# Patient Record
Sex: Female | Born: 1950
Health system: Southern US, Community
[De-identification: ages and names within clinical notes are randomized; demographics above are authoritative.]

## PROBLEM LIST (undated history)

## (undated) DIAGNOSIS — E7800 Pure hypercholesterolemia, unspecified: Secondary | ICD-10-CM

## (undated) DIAGNOSIS — I10 Essential (primary) hypertension: Secondary | ICD-10-CM

## (undated) DIAGNOSIS — M1990 Unspecified osteoarthritis, unspecified site: Secondary | ICD-10-CM

## (undated) DIAGNOSIS — K219 Gastro-esophageal reflux disease without esophagitis: Secondary | ICD-10-CM

## (undated) HISTORY — PX: COLONOSCOPY W/ POLYPECTOMY: SHX1380

## (undated) HISTORY — PX: TUBAL LIGATION: SHX77

---

## 2012-04-15 DIAGNOSIS — E7800 Pure hypercholesterolemia, unspecified: Secondary | ICD-10-CM

## 2012-04-15 HISTORY — DX: Pure hypercholesterolemia, unspecified: E78.00

## 2012-04-26 ENCOUNTER — Encounter (HOSPITAL_COMMUNITY): Payer: Self-pay | Admitting: Pharmacy Technician

## 2012-04-26 NOTE — Progress Notes (Signed)
04-26-12 need MD order entry in Epic, PST visit is 05-03-12

## 2012-04-29 ENCOUNTER — Other Ambulatory Visit: Payer: Self-pay | Admitting: Orthopedic Surgery

## 2012-04-29 MED ORDER — DEXAMETHASONE SODIUM PHOSPHATE 10 MG/ML IJ SOLN: 10.0000 mg | Freq: Once | INTRAMUSCULAR | Status: DC

## 2012-04-29 MED ORDER — BUPIVACAINE LIPOSOME 1.3 % IJ SUSP: 20.0000 mL | Freq: Once | INTRAMUSCULAR | Status: DC

## 2012-04-29 NOTE — Progress Notes (Signed)
Preoperative surgical orders have been place into the Epic hospital system for Dorothy Lawson on 04/29/2012, 11:05 AM  by Patrica Duel for surgery on 05/10/2012.  Preop Total Knee orders including Experal, IV Tylenol, and IV Decadron as long as there are no contraindications to the above medications. Avel Peace, PA-C

## 2012-05-03 ENCOUNTER — Ambulatory Visit (HOSPITAL_COMMUNITY)
Admission: RE | Admit: 2012-05-03 | Discharge: 2012-05-03 | Disposition: A | Discharge status: Home / Self Care | Payer: BC Managed Care – PPO | Source: Ambulatory Visit | Attending: Orthopedic Surgery | Admitting: Orthopedic Surgery

## 2012-05-03 ENCOUNTER — Encounter (HOSPITAL_COMMUNITY)
Admission: RE | Admit: 2012-05-03 | Discharge: 2012-05-03 | Disposition: A | Discharge status: Home / Self Care | Payer: BC Managed Care – PPO | Source: Ambulatory Visit | Attending: Orthopedic Surgery | Admitting: Orthopedic Surgery

## 2012-05-03 ENCOUNTER — Encounter (HOSPITAL_COMMUNITY): Payer: Self-pay

## 2012-05-03 DIAGNOSIS — M1710 Unilateral primary osteoarthritis, unspecified knee: Secondary | ICD-10-CM | POA: Insufficient documentation

## 2012-05-03 DIAGNOSIS — I10 Essential (primary) hypertension: Secondary | ICD-10-CM | POA: Insufficient documentation

## 2012-05-03 DIAGNOSIS — M1990 Unspecified osteoarthritis, unspecified site: Secondary | ICD-10-CM

## 2012-05-03 DIAGNOSIS — Z01812 Encounter for preprocedural laboratory examination: Secondary | ICD-10-CM | POA: Insufficient documentation

## 2012-05-03 DIAGNOSIS — M4180 Other forms of scoliosis, site unspecified: Secondary | ICD-10-CM | POA: Insufficient documentation

## 2012-05-03 DIAGNOSIS — Z01818 Encounter for other preprocedural examination: Secondary | ICD-10-CM | POA: Insufficient documentation

## 2012-05-03 HISTORY — DX: Gastro-esophageal reflux disease without esophagitis: K21.9

## 2012-05-03 HISTORY — DX: Unspecified osteoarthritis, unspecified site: M19.90

## 2012-05-03 HISTORY — DX: Essential (primary) hypertension: I10

## 2012-05-03 HISTORY — DX: Pure hypercholesterolemia, unspecified: E78.00

## 2012-05-03 LAB — CBC
MCH: 31 pg (ref 26.0–34.0)
MCHC: 33.7 g/dL (ref 30.0–36.0)
Platelets: 293 10*3/uL (ref 150–400)
RBC: 4.1 MIL/uL (ref 3.87–5.11)

## 2012-05-03 LAB — COMPREHENSIVE METABOLIC PANEL
ALT: 14 U/L (ref 0–35)
AST: 18 U/L (ref 0–37)
Calcium: 9.6 mg/dL (ref 8.4–10.5)
Sodium: 139 mEq/L (ref 135–145)
Total Protein: 6.8 g/dL (ref 6.0–8.3)

## 2012-05-03 LAB — URINALYSIS, ROUTINE W REFLEX MICROSCOPIC
Protein, ur: NEGATIVE mg/dL
Urobilinogen, UA: 0.2 mg/dL (ref 0.0–1.0)

## 2012-05-03 LAB — SURGICAL PCR SCREEN
MRSA, PCR: NEGATIVE
Staphylococcus aureus: NEGATIVE

## 2012-05-03 LAB — URINE MICROSCOPIC-ADD ON

## 2012-05-03 NOTE — Patient Instructions (Signed)
[address] BRIG[address] ESTELL  05/03/2012   Your procedure is scheduled on: 4-7  -2014  Report to [facility] at     1000   AM .  Call this number if you have problems the morning of surgery: [phone]  Or Presurgical Testing [phone](Lucrecia Mcphearson)   Do not eat food:After Midnight.  May have clear liquids:up to 6 Hours before arrival. Nothing after : 0700 AM  Clear liquids include soda, tea, black coffee, apple or grape juice, broth.  Take these medicines the morning of surgery with A SIP OF WATER: Metoprolol   Do not wear jewelry, make-up or nail polish.  Do not wear lotions, powders, or perfumes. You may wear deodorant.  Do not shave 12 hours prior to first CHG shower(legs and under arms).(face and neck okay.)  Do not bring valuables to the hospital.  Contacts, dentures or bridgework,body piercing,  may not be worn into surgery.  Leave suitcase in the car. After surgery it may be brought to your room.  For patients admitted to the hospital, checkout time is 11:00 AM the day of discharge.   Patients discharged the day of surgery will not be allowed to drive home. Must have responsible person with you x 24 hours once discharged.  Name and phone number of your driver: Alvena Kiernan, spouse- [phone] cell  Special Instructions: CHG(Chlorhedine 4%-"Hibiclens","Betasept","Aplicare") Shower Use Special Wash: see special instructions.(avoid face and genitals)   Please read over the following fact sheets that you were given: MRSA Information, Blood Transfusion fact sheet, Incentive Spirometry Instruction.    Failure to follow these instructions may result in Cancellation of your surgery.   Patient signature_______________________________________________________

## 2012-05-03 NOTE — Pre-Procedure Instructions (Signed)
05-03-12 EKG/ CXR done today.

## 2012-05-04 LAB — URINE CULTURE

## 2012-05-09 ENCOUNTER — Other Ambulatory Visit: Payer: Self-pay | Admitting: Orthopedic Surgery

## 2012-05-09 MED ORDER — TRANEXAMIC ACID 100 MG/ML IV SOLN: 1000.0000 mg | INTRAVENOUS | Status: DC

## 2012-05-09 NOTE — H&P (Signed)
Dorothy Lawson  DOB: Aug 02, 1950 Married / Language: English / Race: White Female  Date of Admission:  05/10/2012  Chief Complaint:  Left Knee Pain  History of Present Illness The patient is a 62 year old female who comes in today for a preoperative History and Physical. The patient is scheduled for a left total knee arthroplasty to be performed by Dr. Gus Rankin. Aluisio, MD at [facility] on 05/10/2012. The patient is a 62 year old female who presents with knee complaints. The patient is seen today for a second opinion. The patient reports left knee symptoms which began 3 year(s) ago without any known injury.The patient feels that the symptoms are worsening. Prior to being seen today the patient was previously evaluated by a colleague (Dr. Chaney Malling). Previous work-up for this problem has included knee x-rays. Past treatment for this problem has included knee brace, intra-articular injection of corticosteroids, nonsteroidal anti-inflammatory drugs and non-opioid analgesics. Symptoms are reported to be located in the left knee and include knee pain, tenderness, swelling and decreased range of motion. Her pain has gotten progressively worse over time. She's had cortisone and Visco supplements in the past. The left knee is now causing her to be unable to do things that she desires. It's limiting what she can and cannot do. Pain occurs throughout the day. Sometimes it is also associated with pain at night. She's been told in the past that she had bad arthritis and would eventually need a knee replacement. She is ready to proceed with surgery. They have been treated conservatively in the past for the above stated problem and despite conservative measures, they continue to have progressive pain and severe functional limitations and dysfunction. They have failed non-operative management including home exercise, medications. It is felt that they would benefit from undergoing total joint  replacement. Risks and benefits of the procedure have been discussed with the patient and they elect to proceed with surgery. There are no active contraindications to surgery such as ongoing infection or rapidly progressive neurological disease.  Problem List Primary osteoarthritis of one knee (715.16)   Family History Hypertension. father Cancer. mother and sister   Social History Marital status. married Number of flights of stairs before winded. 2-3 Living situation. live with spouse Exercise. Exercises daily; does other Illicit drug use. no Pain Contract. no Tobacco / smoke exposure. no Tobacco use. never smoker Drug/Alcohol Rehab (Previously). no Current work status. 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 2 Alcohol use. never consumed alcohol Post-Surgical Plans. Plan is to go home with husband. Advance Directives. Living Will, Healthcare POA   Medication History Triamterene-HCTZ (37.5-25MG  Tablet, Oral) Active. Metoprolol Tartrate (50MG  Tablet, Oral) Active. Pravastatin Sodium (40MG  Tablet, Oral) Active. Famotidine (20MG  (Dis) Tablet, Oral) Active.   Past Surgical History Colon Polyp Removal - Colonoscopy. Date: 01/25/2007. Tubal Ligation. Date: 07/1983.  Medical History High blood pressure Hypercholesterolemia Urinary Tract Infection   Review of Systems General:Not Present- Chills, Fever, Night Sweats, Fatigue, Weight Gain, Weight Loss and Memory Loss. Skin:Not Present- Hives, Itching, Rash, Eczema and Lesions. HEENT:Not Present- Tinnitus, Headache, Double Vision, Visual Loss, Hearing Loss and Dentures. Respiratory:Not Present- Shortness of breath with exertion, Shortness of breath at rest, Allergies, Coughing up blood and Chronic Cough. Cardiovascular:Not Present- Chest Pain, Racing/skipping heartbeats, Difficulty Breathing Lying Down, Murmur, Swelling and Palpitations. Gastrointestinal:Not Present- Bloody  Stool, Heartburn, Abdominal Pain, Vomiting, Nausea, Constipation, Diarrhea, Difficulty Swallowing, Jaundice and Loss of appetitie. Female Genitourinary:Not Present- Blood in Urine, Urinary frequency, Weak urinary stream, Discharge,  Flank Pain, Incontinence, Painful Urination, Urgency, Urinary Retention and Urinating at Night. Musculoskeletal:Present- Joint Swelling and Joint Pain. Not Present- Muscle Weakness, Muscle Pain, Back Pain, Morning Stiffness and Spasms. Neurological:Not Present- Tremor, Dizziness, Blackout spells, Paralysis, Difficulty with balance and Weakness. Psychiatric:Not Present- Insomnia.   Vitals Weight: 158 lb Height: 67 in Body Surface Area: 1.84 m Body Mass Index: 24.75 kg/m Pulse: 60 (Regular) Resp.: 14 (Unlabored) BP: 142/78 (Sitting, Left Arm, Standard)    Physical Exam The physical exam findings are as follows:  Note: Patient is a 62 year old female with continued knee pain.   General Mental Status - Alert, cooperative and good historian. General Appearance- pleasant. Not in acute distress. Orientation- Oriented X3. Build & Nutrition- Well nourished and Well developed.   Head and Neck Head- normocephalic, atraumatic . Neck Global Assessment- supple. no bruit auscultated on the right and no bruit auscultated on the left.   Eye Vision- Wears corrective lenses (reading glasses). Pupil- Bilateral- Regular and Round. Motion- Bilateral- EOMI.   Chest and Lung Exam Auscultation: Breath sounds:- clear at anterior chest wall and - clear at posterior chest wall. Adventitious sounds:- No Adventitious sounds.   Cardiovascular Auscultation:Rhythm- Regular rate and rhythm. Heart Sounds- S1 WNL and S2 WNL. Murmurs & Other Heart Sounds:Auscultation of the heart reveals - No Murmurs.   Abdomen Palpation/Percussion:Tenderness- Abdomen is non-tender to palpation. Rigidity (guarding)- Abdomen is  soft. Auscultation:Auscultation of the abdomen reveals - Bowel sounds normal.   Female Genitourinary Not done, not pertinent to present illness  Musculoskeletal On exam, well-developed female, alert and oriented, in no apparent distress. Evaluation of her hips shows normal ROM with no discomfort. Her right knee shows no effusion. Range is 0-135 with no swelling, tenderness or instability. The left knee has no effusion, range about 5-125, moderate crepitus on ROM, tender medial greater than lateral with no instability noted. Pulses, sensation and motor are intact.  RADIOGRAPHS: AP of both knees and lateral show bone on bone arthritis, medial and patellofemoral compartments of the left knee.  Assessment & Plan Primary osteoarthritis of one knee (715.16) Impression: Left Knee  Note: Plan is for a Left Total Knee Replacement by Dr. Lequita Halt.  Plan is to go home with her husband.  PCP - Dr. Wyvonnia Lora - Patient has been seen preoperatively and felt to be stable for surgery. Jonita Albee, [place]  Signed electronically by Roberts Gaudy, PA-C

## 2012-05-10 ENCOUNTER — Encounter (HOSPITAL_COMMUNITY): Payer: Self-pay | Admitting: Anesthesiology

## 2012-05-10 ENCOUNTER — Inpatient Hospital Stay (HOSPITAL_COMMUNITY): Payer: BC Managed Care – PPO | Admitting: Anesthesiology

## 2012-05-10 ENCOUNTER — Inpatient Hospital Stay (HOSPITAL_COMMUNITY)
Admission: RE | Admit: 2012-05-10 | Discharge: 2012-05-13 | DRG: 209 | Disposition: A | Discharge status: Home Health | Payer: BC Managed Care – PPO | Source: Ambulatory Visit | Attending: Orthopedic Surgery | Admitting: Orthopedic Surgery

## 2012-05-10 ENCOUNTER — Encounter (HOSPITAL_COMMUNITY): Payer: Self-pay | Admitting: *Deleted

## 2012-05-10 ENCOUNTER — Encounter (HOSPITAL_COMMUNITY)
Admission: RE | Disposition: A | Discharge status: Home Health | Payer: Self-pay | Source: Ambulatory Visit | Attending: Orthopedic Surgery

## 2012-05-10 DIAGNOSIS — I10 Essential (primary) hypertension: Secondary | ICD-10-CM | POA: Diagnosis present

## 2012-05-10 DIAGNOSIS — D62 Acute posthemorrhagic anemia: Secondary | ICD-10-CM

## 2012-05-10 DIAGNOSIS — Z96651 Presence of right artificial knee joint: Secondary | ICD-10-CM

## 2012-05-10 DIAGNOSIS — M1710 Unilateral primary osteoarthritis, unspecified knee: Principal | ICD-10-CM | POA: Diagnosis present

## 2012-05-10 DIAGNOSIS — M179 Osteoarthritis of knee, unspecified: Secondary | ICD-10-CM

## 2012-05-10 DIAGNOSIS — R112 Nausea with vomiting, unspecified: Secondary | ICD-10-CM | POA: Diagnosis not present

## 2012-05-10 DIAGNOSIS — Z96652 Presence of left artificial knee joint: Secondary | ICD-10-CM

## 2012-05-10 DIAGNOSIS — E871 Hypo-osmolality and hyponatremia: Secondary | ICD-10-CM

## 2012-05-10 DIAGNOSIS — E7800 Pure hypercholesterolemia, unspecified: Secondary | ICD-10-CM | POA: Diagnosis present

## 2012-05-10 HISTORY — PX: TOTAL KNEE ARTHROPLASTY: SHX125

## 2012-05-10 LAB — TYPE AND SCREEN
ABO/RH(D): O POS
Antibody Screen: NEGATIVE

## 2012-05-10 SURGERY — ARTHROPLASTY, KNEE, TOTAL
Anesthesia: Spinal | Site: Knee | Laterality: Left | Wound class: Clean

## 2012-05-10 MED ORDER — PROPOFOL INFUSION 10 MG/ML OPTIME
INTRAVENOUS | Status: DC | PRN
  Administered 2012-05-10: 75 ug/kg/min via INTRAVENOUS

## 2012-05-10 MED ORDER — SODIUM CHLORIDE 0.9 % IJ SOLN
INTRAMUSCULAR | Status: DC | PRN
  Administered 2012-05-10: 14:00:00

## 2012-05-10 MED ORDER — FLEET ENEMA 7-19 GM/118ML RE ENEM: 1.0000 | ENEMA | Freq: Once | RECTAL | Status: AC | PRN

## 2012-05-10 MED ORDER — DEXTROSE 5 % IV SOLN
500.0000 mg | Freq: Four times a day (QID) | INTRAVENOUS | Status: DC | PRN
  Filled 2012-05-10: qty 5

## 2012-05-10 MED ORDER — PSYLLIUM 95 % PO PACK
1.0000 | PACK | Freq: Every day | ORAL | Status: DC
  Administered 2012-05-11: 1 via ORAL
  Filled 2012-05-10 (×4): qty 1

## 2012-05-10 MED ORDER — ACETAMINOPHEN 650 MG RE SUPP: 650.0000 mg | Freq: Four times a day (QID) | RECTAL | Status: DC | PRN

## 2012-05-10 MED ORDER — BUPIVACAINE LIPOSOME 1.3 % IJ SUSP
20.0000 mL | Freq: Once | INTRAMUSCULAR | Status: DC
  Filled 2012-05-10: qty 20

## 2012-05-10 MED ORDER — POLYETHYLENE GLYCOL 3350 17 G PO PACK: 17.0000 g | PACK | Freq: Every day | ORAL | Status: DC | PRN

## 2012-05-10 MED ORDER — PHENOL 1.4 % MT LIQD: 1.0000 | OROMUCOSAL | Status: DC | PRN

## 2012-05-10 MED ORDER — FENTANYL CITRATE 0.05 MG/ML IJ SOLN: 25.0000 ug | INTRAMUSCULAR | Status: DC | PRN

## 2012-05-10 MED ORDER — PHENYLEPHRINE HCL 10 MG/ML IJ SOLN
INTRAMUSCULAR | Status: DC | PRN
  Administered 2012-05-10 (×2): 40 ug via INTRAVENOUS

## 2012-05-10 MED ORDER — SODIUM CHLORIDE 0.9 % IR SOLN
Status: DC | PRN
  Administered 2012-05-10: 1000 mL

## 2012-05-10 MED ORDER — ACETAMINOPHEN 10 MG/ML IV SOLN
INTRAVENOUS | Status: DC | PRN
  Administered 2012-05-10: 1000 mg via INTRAVENOUS

## 2012-05-10 MED ORDER — ACETAMINOPHEN 325 MG PO TABS: 650.0000 mg | ORAL_TABLET | Freq: Four times a day (QID) | ORAL | Status: DC | PRN

## 2012-05-10 MED ORDER — MIDAZOLAM HCL 5 MG/5ML IJ SOLN
INTRAMUSCULAR | Status: DC | PRN
  Administered 2012-05-10: 2 mg via INTRAVENOUS

## 2012-05-10 MED ORDER — DOCUSATE SODIUM 100 MG PO CAPS
100.0000 mg | ORAL_CAPSULE | Freq: Two times a day (BID) | ORAL | Status: DC
  Administered 2012-05-10 – 2012-05-13 (×6): 100 mg via ORAL

## 2012-05-10 MED ORDER — DEXAMETHASONE 6 MG PO TABS
10.0000 mg | ORAL_TABLET | Freq: Once | ORAL | Status: AC
  Filled 2012-05-10: qty 1

## 2012-05-10 MED ORDER — ONDANSETRON HCL 4 MG PO TABS: 4.0000 mg | ORAL_TABLET | Freq: Four times a day (QID) | ORAL | Status: DC | PRN

## 2012-05-10 MED ORDER — 0.9 % SODIUM CHLORIDE (POUR BTL) OPTIME
TOPICAL | Status: DC | PRN
  Administered 2012-05-10: 1000 mL

## 2012-05-10 MED ORDER — OXYCODONE HCL 5 MG PO TABS
5.0000 mg | ORAL_TABLET | ORAL | Status: DC | PRN
  Administered 2012-05-10 – 2012-05-12 (×13): 10 mg via ORAL
  Administered 2012-05-13: 5 mg via ORAL
  Administered 2012-05-13: 10 mg via ORAL
  Filled 2012-05-10 (×15): qty 2

## 2012-05-10 MED ORDER — METOCLOPRAMIDE HCL 10 MG PO TABS: 5.0000 mg | ORAL_TABLET | Freq: Three times a day (TID) | ORAL | Status: DC | PRN

## 2012-05-10 MED ORDER — METOCLOPRAMIDE HCL 5 MG/ML IJ SOLN
5.0000 mg | Freq: Three times a day (TID) | INTRAMUSCULAR | Status: DC | PRN
  Administered 2012-05-11: 10 mg via INTRAVENOUS
  Filled 2012-05-10: qty 2

## 2012-05-10 MED ORDER — LACTATED RINGERS IV SOLN
INTRAVENOUS | Status: DC | PRN
  Administered 2012-05-10 (×3): via INTRAVENOUS

## 2012-05-10 MED ORDER — FAMOTIDINE 20 MG PO TABS
20.0000 mg | ORAL_TABLET | Freq: Every day | ORAL | Status: DC
Start: 2012-05-10 — End: 2012-05-13
  Administered 2012-05-10 – 2012-05-12 (×3): 20 mg via ORAL
  Filled 2012-05-10 (×4): qty 1

## 2012-05-10 MED ORDER — DIPHENHYDRAMINE HCL 12.5 MG/5ML PO ELIX: 12.5000 mg | ORAL_SOLUTION | ORAL | Status: DC | PRN

## 2012-05-10 MED ORDER — ACETAMINOPHEN 10 MG/ML IV SOLN
1000.0000 mg | Freq: Four times a day (QID) | INTRAVENOUS | Status: AC
  Administered 2012-05-10 – 2012-05-11 (×4): 1000 mg via INTRAVENOUS
  Filled 2012-05-10 (×7): qty 100

## 2012-05-10 MED ORDER — DEXAMETHASONE SODIUM PHOSPHATE 10 MG/ML IJ SOLN
10.0000 mg | Freq: Once | INTRAMUSCULAR | Status: AC
  Administered 2012-05-11: 10 mg via INTRAVENOUS
  Filled 2012-05-10: qty 1

## 2012-05-10 MED ORDER — RIVAROXABAN 10 MG PO TABS
10.0000 mg | ORAL_TABLET | Freq: Every day | ORAL | Status: DC
  Administered 2012-05-11 – 2012-05-13 (×3): 10 mg via ORAL
  Filled 2012-05-10 (×5): qty 1

## 2012-05-10 MED ORDER — TRAMADOL HCL 50 MG PO TABS
50.0000 mg | ORAL_TABLET | Freq: Four times a day (QID) | ORAL | Status: DC | PRN
  Administered 2012-05-12: 50 mg via ORAL
  Administered 2012-05-13: 100 mg via ORAL
  Filled 2012-05-10: qty 2
  Filled 2012-05-10: qty 1

## 2012-05-10 MED ORDER — BISACODYL 10 MG RE SUPP: 10.0000 mg | Freq: Every day | RECTAL | Status: DC | PRN

## 2012-05-10 MED ORDER — FENTANYL CITRATE 0.05 MG/ML IJ SOLN
INTRAMUSCULAR | Status: DC | PRN
  Administered 2012-05-10: 100 ug via INTRAVENOUS

## 2012-05-10 MED ORDER — METOPROLOL TARTRATE 50 MG PO TABS
50.0000 mg | ORAL_TABLET | Freq: Two times a day (BID) | ORAL | Status: DC
  Administered 2012-05-11 – 2012-05-13 (×5): 50 mg via ORAL
  Filled 2012-05-10 (×7): qty 1

## 2012-05-10 MED ORDER — CEFAZOLIN SODIUM-DEXTROSE 2-3 GM-% IV SOLR
INTRAVENOUS | Status: DC | PRN
  Administered 2012-05-10: 2 g via INTRAVENOUS

## 2012-05-10 MED ORDER — LACTATED RINGERS IV SOLN: INTRAVENOUS | Status: DC

## 2012-05-10 MED ORDER — PROMETHAZINE HCL 25 MG/ML IJ SOLN: 6.2500 mg | INTRAMUSCULAR | Status: DC | PRN

## 2012-05-10 MED ORDER — DEXTROSE-NACL 5-0.9 % IV SOLN
INTRAVENOUS | Status: DC
  Administered 2012-05-10: 18:00:00 via INTRAVENOUS

## 2012-05-10 MED ORDER — ONDANSETRON HCL 4 MG/2ML IJ SOLN
4.0000 mg | Freq: Four times a day (QID) | INTRAMUSCULAR | Status: DC | PRN
  Administered 2012-05-11: 4 mg via INTRAVENOUS
  Filled 2012-05-10: qty 2

## 2012-05-10 MED ORDER — SIMVASTATIN 20 MG PO TABS
20.0000 mg | ORAL_TABLET | Freq: Every day | ORAL | Status: DC
  Administered 2012-05-10 – 2012-05-12 (×3): 20 mg via ORAL
  Filled 2012-05-10 (×4): qty 1

## 2012-05-10 MED ORDER — BUPIVACAINE IN DEXTROSE 0.75-8.25 % IT SOLN
INTRATHECAL | Status: DC | PRN
  Administered 2012-05-10: 1.6 mL via INTRATHECAL

## 2012-05-10 MED ORDER — METHOCARBAMOL 500 MG PO TABS
500.0000 mg | ORAL_TABLET | Freq: Four times a day (QID) | ORAL | Status: DC | PRN
  Administered 2012-05-10 – 2012-05-13 (×9): 500 mg via ORAL
  Filled 2012-05-10 (×9): qty 1

## 2012-05-10 MED ORDER — KETOROLAC TROMETHAMINE 30 MG/ML IJ SOLN: 15.0000 mg | Freq: Once | INTRAMUSCULAR | Status: DC | PRN

## 2012-05-10 MED ORDER — MORPHINE SULFATE 2 MG/ML IJ SOLN
1.0000 mg | INTRAMUSCULAR | Status: DC | PRN
  Administered 2012-05-10 – 2012-05-11 (×4): 2 mg via INTRAVENOUS
  Filled 2012-05-10 (×4): qty 1

## 2012-05-10 MED ORDER — MORPHINE SULFATE 2 MG/ML IJ SOLN
INTRAMUSCULAR | Status: AC
  Filled 2012-05-10: qty 1

## 2012-05-10 MED ORDER — CEFAZOLIN SODIUM 1-5 GM-% IV SOLN
1.0000 g | Freq: Four times a day (QID) | INTRAVENOUS | Status: AC
  Administered 2012-05-10 – 2012-05-11 (×2): 1 g via INTRAVENOUS
  Filled 2012-05-10 (×2): qty 50

## 2012-05-10 MED ORDER — MENTHOL 3 MG MT LOZG: 1.0000 | LOZENGE | OROMUCOSAL | Status: DC | PRN

## 2012-05-10 MED ORDER — TRIAMTERENE-HCTZ 37.5-25 MG PO TABS
1.0000 | ORAL_TABLET | Freq: Every day | ORAL | Status: DC
  Administered 2012-05-11 – 2012-05-12 (×2): 1 via ORAL
  Filled 2012-05-10 (×4): qty 1

## 2012-05-10 SURGICAL SUPPLY — 55 items
BAG ZIPLOCK 12X15 (MISCELLANEOUS) ×2 IMPLANT
BANDAGE ELASTIC 6 VELCRO ST LF (GAUZE/BANDAGES/DRESSINGS) ×2 IMPLANT
BANDAGE ESMARK 6X9 LF (GAUZE/BANDAGES/DRESSINGS) ×1 IMPLANT
BLADE SAG 18X100X1.27 (BLADE) ×2 IMPLANT
BLADE SAW SGTL 11.0X1.19X90.0M (BLADE) ×2 IMPLANT
BNDG ESMARK 6X9 LF (GAUZE/BANDAGES/DRESSINGS) ×2
BOWL SMART MIX CTS (DISPOSABLE) ×2 IMPLANT
CEMENT HV SMART SET (Cement) ×4 IMPLANT
CLOTH BEACON ORANGE TIMEOUT ST (SAFETY) ×2 IMPLANT
CUFF TOURN SGL QUICK 34 (TOURNIQUET CUFF) ×1
CUFF TRNQT CYL 34X4X40X1 (TOURNIQUET CUFF) ×1 IMPLANT
DRAPE EXTREMITY T 121X128X90 (DRAPE) ×2 IMPLANT
DRAPE POUCH INSTRU U-SHP 10X18 (DRAPES) ×2 IMPLANT
DRAPE U-SHAPE 47X51 STRL (DRAPES) ×2 IMPLANT
DRSG ADAPTIC 3X8 NADH LF (GAUZE/BANDAGES/DRESSINGS) ×2 IMPLANT
DURAPREP 26ML APPLICATOR (WOUND CARE) ×2 IMPLANT
ELECT REM PT RETURN 9FT ADLT (ELECTROSURGICAL) ×2
ELECTRODE REM PT RTRN 9FT ADLT (ELECTROSURGICAL) ×1 IMPLANT
EVACUATOR 1/8 PVC DRAIN (DRAIN) ×2 IMPLANT
FACESHIELD LNG OPTICON STERILE (SAFETY) ×10 IMPLANT
GLOVE BIO SURGEON STRL SZ7.5 (GLOVE) ×2 IMPLANT
GLOVE BIO SURGEON STRL SZ8 (GLOVE) ×2 IMPLANT
GLOVE BIOGEL PI IND STRL 6.5 (GLOVE) ×4 IMPLANT
GLOVE BIOGEL PI IND STRL 8 (GLOVE) ×2 IMPLANT
GLOVE BIOGEL PI INDICATOR 6.5 (GLOVE) ×4
GLOVE BIOGEL PI INDICATOR 8 (GLOVE) ×2
GLOVE SURG SS PI 6.5 STRL IVOR (GLOVE) IMPLANT
GOWN STRL NON-REIN LRG LVL3 (GOWN DISPOSABLE) ×2 IMPLANT
GOWN STRL REIN XL XLG (GOWN DISPOSABLE) ×8 IMPLANT
HANDPIECE INTERPULSE COAX TIP (DISPOSABLE) ×1
IMMOBILIZER KNEE 20 (SOFTGOODS) ×2
IMMOBILIZER KNEE 20 THIGH 36 (SOFTGOODS) ×1 IMPLANT
KIT BASIN OR (CUSTOM PROCEDURE TRAY) ×2 IMPLANT
MANIFOLD NEPTUNE II (INSTRUMENTS) ×2 IMPLANT
NDL SAFETY ECLIPSE 18X1.5 (NEEDLE) ×1 IMPLANT
NEEDLE HYPO 18GX1.5 SHARP (NEEDLE) ×1
NEEDLE SPNL 20GX3.5 QUINCKE YW (NEEDLE) ×2 IMPLANT
NS IRRIG 1000ML POUR BTL (IV SOLUTION) ×2 IMPLANT
PACK TOTAL JOINT (CUSTOM PROCEDURE TRAY) ×2 IMPLANT
PAD ABD 7.5X8 STRL (GAUZE/BANDAGES/DRESSINGS) ×2 IMPLANT
PADDING CAST COTTON 6X4 STRL (CAST SUPPLIES) ×6 IMPLANT
POSITIONER SURGICAL ARM (MISCELLANEOUS) ×2 IMPLANT
SET HNDPC FAN SPRY TIP SCT (DISPOSABLE) ×1 IMPLANT
SPONGE GAUZE 4X4 12PLY (GAUZE/BANDAGES/DRESSINGS) ×2 IMPLANT
STRIP CLOSURE SKIN 1/2X4 (GAUZE/BANDAGES/DRESSINGS) ×4 IMPLANT
SUCTION FRAZIER 12FR DISP (SUCTIONS) ×2 IMPLANT
SUT MNCRL AB 4-0 PS2 18 (SUTURE) ×2 IMPLANT
SUT VIC AB 2-0 CT1 27 (SUTURE) ×3
SUT VIC AB 2-0 CT1 TAPERPNT 27 (SUTURE) ×3 IMPLANT
SUT VLOC 180 0 24IN GS25 (SUTURE) ×2 IMPLANT
SYR 50ML LL SCALE MARK (SYRINGE) ×2 IMPLANT
TOWEL OR 17X26 10 PK STRL BLUE (TOWEL DISPOSABLE) ×4 IMPLANT
TRAY FOLEY CATH 14FRSI W/METER (CATHETERS) ×2 IMPLANT
WATER STERILE IRR 1500ML POUR (IV SOLUTION) ×2 IMPLANT
WRAP KNEE MAXI GEL POST OP (GAUZE/BANDAGES/DRESSINGS) ×4 IMPLANT

## 2012-05-10 NOTE — H&P (View-Only) (Signed)
James H. Cancio  DOB: 08/07/1950 Married / Language: English / Race: White Female  Date of Admission:  05/10/2012  Chief Complaint:  Left Knee Pain  History of Present Illness The patient is a 62 year old female who comes in today for a preoperative History and Physical. The patient is scheduled for a left total knee arthroplasty to be performed by Dr. Frank V. Aluisio, MD at [facility] on 05/10/2012. The patient is a 62 year old female who presents with knee complaints. The patient is seen today for a second opinion. The patient reports left knee symptoms which began 3 year(s) ago without any known injury.The patient feels that the symptoms are worsening. Prior to being seen today the patient was previously evaluated by a colleague (Dr. Mortenson). Previous work-up for this problem has included knee x-rays. Past treatment for this problem has included knee brace, intra-articular injection of corticosteroids, nonsteroidal anti-inflammatory drugs and non-opioid analgesics. Symptoms are reported to be located in the left knee and include knee pain, tenderness, swelling and decreased range of motion. Her pain has gotten progressively worse over time. She's had cortisone and Visco supplements in the past. The left knee is now causing her to be unable to do things that she desires. It's limiting what she can and cannot do. Pain occurs throughout the day. Sometimes it is also associated with pain at night. She's been told in the past that she had bad arthritis and would eventually need a knee replacement. She is ready to proceed with surgery. They have been treated conservatively in the past for the above stated problem and despite conservative measures, they continue to have progressive pain and severe functional limitations and dysfunction. They have failed non-operative management including home exercise, medications. It is felt that they would benefit from undergoing total joint  replacement. Risks and benefits of the procedure have been discussed with the patient and they elect to proceed with surgery. There are no active contraindications to surgery such as ongoing infection or rapidly progressive neurological disease.  Problem List Primary osteoarthritis of one knee (715.16)   Family History Hypertension. father Cancer. mother and sister   Social History Marital status. married Number of flights of stairs before winded. 2-3 Living situation. live with spouse Exercise. Exercises daily; does other Illicit drug use. no Pain Contract. no Tobacco / smoke exposure. no Tobacco use. never smoker Drug/Alcohol Rehab (Previously). no Current work status. retired teacher Drug/Alcohol Rehab (Currently). no Children. 2 Alcohol use. never consumed alcohol Post-Surgical Plans. Plan is to go home with husband. Advance Directives. Living Will, Healthcare POA   Medication History Triamterene-HCTZ (37.5-25MG Tablet, Oral) Active. Metoprolol Tartrate (50MG Tablet, Oral) Active. Pravastatin Sodium (40MG Tablet, Oral) Active. Famotidine (20MG (Dis) Tablet, Oral) Active.   Past Surgical History Colon Polyp Removal - Colonoscopy. Date: 01/25/2007. Tubal Ligation. Date: 07/1983.  Medical History High blood pressure Hypercholesterolemia Urinary Tract Infection   Review of Systems General:Not Present- Chills, Fever, Night Sweats, Fatigue, Weight Gain, Weight Loss and Memory Loss. Skin:Not Present- Hives, Itching, Rash, Eczema and Lesions. HEENT:Not Present- Tinnitus, Headache, Double Vision, Visual Loss, Hearing Loss and Dentures. Respiratory:Not Present- Shortness of breath with exertion, Shortness of breath at rest, Allergies, Coughing up blood and Chronic Cough. Cardiovascular:Not Present- Chest Pain, Racing/skipping heartbeats, Difficulty Breathing Lying Down, Murmur, Swelling and Palpitations. Gastrointestinal:Not Present- Bloody  Stool, Heartburn, Abdominal Pain, Vomiting, Nausea, Constipation, Diarrhea, Difficulty Swallowing, Jaundice and Loss of appetitie. Female Genitourinary:Not Present- Blood in Urine, Urinary frequency, Weak urinary stream, Discharge,   Flank Pain, Incontinence, Painful Urination, Urgency, Urinary Retention and Urinating at Night. Musculoskeletal:Present- Joint Swelling and Joint Pain. Not Present- Muscle Weakness, Muscle Pain, Back Pain, Morning Stiffness and Spasms. Neurological:Not Present- Tremor, Dizziness, Blackout spells, Paralysis, Difficulty with balance and Weakness. Psychiatric:Not Present- Insomnia.   Vitals Weight: 158 lb Height: 67 in Body Surface Area: 1.84 m Body Mass Index: 24.75 kg/m Pulse: 60 (Regular) Resp.: 14 (Unlabored) BP: 142/78 (Sitting, Left Arm, Standard)    Physical Exam The physical exam findings are as follows:  Note: Patient is a 62 year old female with continued knee pain.   General Mental Status - Alert, cooperative and good historian. General Appearance- pleasant. Not in acute distress. Orientation- Oriented X3. Build & Nutrition- Well nourished and Well developed.   Head and Neck Head- normocephalic, atraumatic . Neck Global Assessment- supple. no bruit auscultated on the right and no bruit auscultated on the left.   Eye Vision- Wears corrective lenses (reading glasses). Pupil- Bilateral- Regular and Round. Motion- Bilateral- EOMI.   Chest and Lung Exam Auscultation: Breath sounds:- clear at anterior chest wall and - clear at posterior chest wall. Adventitious sounds:- No Adventitious sounds.   Cardiovascular Auscultation:Rhythm- Regular rate and rhythm. Heart Sounds- S1 WNL and S2 WNL. Murmurs & Other Heart Sounds:Auscultation of the heart reveals - No Murmurs.   Abdomen Palpation/Percussion:Tenderness- Abdomen is non-tender to palpation. Rigidity (guarding)- Abdomen is  soft. Auscultation:Auscultation of the abdomen reveals - Bowel sounds normal.   Female Genitourinary Not done, not pertinent to present illness  Musculoskeletal On exam, well-developed female, alert and oriented, in no apparent distress. Evaluation of her hips shows normal ROM with no discomfort. Her right knee shows no effusion. Range is 0-135 with no swelling, tenderness or instability. The left knee has no effusion, range about 5-125, moderate crepitus on ROM, tender medial greater than lateral with no instability noted. Pulses, sensation and motor are intact.  RADIOGRAPHS: AP of both knees and lateral show bone on bone arthritis, medial and patellofemoral compartments of the left knee.  Assessment & Plan Primary osteoarthritis of one knee (715.16) Impression: Left Knee  Note: Plan is for a Left Total Knee Replacement by Dr. Aluisio.  Plan is to go home with her husband.  PCP - Dr. David Tapper - Patient has been seen preoperatively and felt to be stable for surgery. [place], [place]  Signed electronically by DREW L PERKINS, PA-C  

## 2012-05-10 NOTE — Interval H&P Note (Signed)
History and Physical Interval Note:  05/10/2012 11:54 AM  Dorothy Lawson  has presented today for surgery, with the diagnosis of Osteoarthritis of the Left Knee  The various methods of treatment have been discussed with the patient and family. After consideration of risks, benefits and other options for treatment, the patient has consented to  Procedure(s): TOTAL KNEE ARTHROPLASTY (Left) as a surgical intervention .  The patient's history has been reviewed, patient examined, no change in status, stable for surgery.  I have reviewed the patient's chart and labs.  Questions were answered to the patient's satisfaction.     Loanne Drilling

## 2012-05-10 NOTE — Anesthesia Preprocedure Evaluation (Signed)
Anesthesia Evaluation  Patient identified by MRN, date of birth, ID band Patient awake    Reviewed: Allergy & Precautions, H&P , NPO status , Patient's Chart, lab work & pertinent test results  Airway Mallampati: II TM Distance: >3 FB Neck ROM: Full    Dental no notable dental hx.    Pulmonary neg pulmonary ROS,  breath sounds clear to auscultation  Pulmonary exam normal       Cardiovascular hypertension, Rhythm:Regular Rate:Normal     Neuro/Psych negative neurological ROS  negative psych ROS   GI/Hepatic Neg liver ROS, GERD-  Medicated,  Endo/Other  negative endocrine ROS  Renal/GU negative Renal ROS  negative genitourinary   Musculoskeletal negative musculoskeletal ROS (+)   Abdominal   Peds negative pediatric ROS (+)  Hematology negative hematology ROS (+)   Anesthesia Other Findings   Reproductive/Obstetrics negative OB ROS                           Anesthesia Physical Anesthesia Plan  ASA: II  Anesthesia Plan: Spinal   Post-op Pain Management:    Induction: Intravenous  Airway Management Planned: Simple Face Mask  Additional Equipment:   Intra-op Plan:   Post-operative Plan:   Informed Consent: I have reviewed the patients History and Physical, chart, labs and discussed the procedure including the risks, benefits and alternatives for the proposed anesthesia with the patient or authorized representative who has indicated his/her understanding and acceptance.     Plan Discussed with: CRNA and Surgeon  Anesthesia Plan Comments:         Anesthesia Quick Evaluation

## 2012-05-10 NOTE — Anesthesia Postprocedure Evaluation (Signed)
  Anesthesia Post-op Note  Patient: Dorothy Lawson  Procedure(s) Performed: Procedure(s) (LRB): TOTAL KNEE ARTHROPLASTY (Left)  Patient Location: PACU  Anesthesia Type: Spinal  Level of Consciousness: awake and alert   Airway and Oxygen Therapy: Patient Spontanous Breathing  Post-op Pain: mild  Post-op Assessment: Post-op Vital signs reviewed, Patient's Cardiovascular Status Stable, Respiratory Function Stable, Patent Airway and No signs of Nausea or vomiting  Last Vitals:  Filed Vitals:   05/10/12 1422  BP:   Pulse: 62  Temp: 35.9 C  Resp: 10    Post-op Vital Signs: stable   Complications: No apparent anesthesia complications

## 2012-05-10 NOTE — Anesthesia Procedure Notes (Signed)

## 2012-05-10 NOTE — Preoperative (Signed)
Beta Blockers   Reason not to administer Beta Blockers:Not Applicable 

## 2012-05-10 NOTE — Op Note (Signed)
Pre-operative diagnosis- Osteoarthritis  Left knee(s)  Post-operative diagnosis- Osteoarthritis Left knee(s)  Procedure-  Left  Total Knee Arthroplasty  Surgeon- Gus Rankin. Inna Tisdell, MD  Assistant- Avel Peace, PA-C   Anesthesia-  Spinal EBL-* No blood loss amount entered *  Drains Hemovac  Tourniquet time-  Total Tourniquet Time Documented: Thigh (Left) - 30 minutes Total: Thigh (Left) - 30 minutes    Complications- None  Condition-PACU - hemodynamically stable.   Brief Clinical Note  Dorothy Lawson is a 62 y.o. year old female with end stage OA of her left knee with progressively worsening pain and dysfunction. She has constant pain, with activity and at rest and significant functional deficits with difficulties even with ADLs. She has had extensive non-op management including analgesics, injections of cortisone and viscosupplements, and home exercise program, but remains in significant pain with significant dysfunction. Radiographs show bone on bone arthritis medial and patellofemoral. She presents now for left Total Knee Arthroplasty.     Procedure in detail---   The patient is brought into the operating room and positioned supine on the operating table. After successful administration of  Spinal,   a tourniquet is placed high on the  Left thigh(s) and the lower extremity is prepped and draped in the usual sterile fashion. Time out is performed by the operating team and then the  Left lower extremity is wrapped in Esmarch, knee flexed and the tourniquet inflated to 300 mmHg.       A midline incision is made with a ten blade through the subcutaneous tissue to the level of the extensor mechanism. A fresh blade is used to make a medial parapatellar arthrotomy. Soft tissue over the proximal medial tibia is subperiosteally elevated to the joint line with a knife and into the semimembranosus bursa with a Cobb elevator. Soft tissue over the proximal lateral tibia is elevated with attention  being paid to avoiding the patellar tendon on the tibial tubercle. The patella is everted, knee flexed 90 degrees and the ACL and PCL are removed. Findings are bone on bone medial and patellofemoral with large medial and patellar osteophytes.        The drill is used to create a starting hole in the distal femur and the canal is thoroughly irrigated with sterile saline to remove the fatty contents. The 5 degree Left  valgus alignment guide is placed into the femoral canal and the distal femoral cutting block is pinned to remove 10 mm off the distal femur. Resection is made with an oscillating saw.      The tibia is subluxed forward and the menisci are removed. The extramedullary alignment guide is placed referencing proximally at the medial aspect of the tibial tubercle and distally along the second metatarsal axis and tibial crest. The block is pinned to remove 2mm off the more deficient medial  side. Resection is made with an oscillating saw. Size 3is the most appropriate size for the tibia and the proximal tibia is prepared with the modular drill and keel punch for that size.      The femoral sizing guide is placed and size 3 is most appropriate. Rotation is marked off the epicondylar axis and confirmed by creating a rectangular flexion gap at 90 degrees. The size 3 cutting block is pinned in this rotation and the anterior, posterior and chamfer cuts are made with the oscillating saw. The intercondylar block is then placed and that cut is made.      Trial size 3 tibial component,  trial size 3 posterior stabilized femur and a 10  mm posterior stabilized rotating platform insert trial is placed. Full extension is achieved with excellent varus/valgus and anterior/posterior balance throughout full range of motion. The patella is everted and thickness measured to be 22  mm. Free hand resection is taken to 12 mm, a 35 template is placed, lug holes are drilled, trial patella is placed, and it tracks normally.  Osteophytes are removed off the posterior femur with the trial in place. All trials are removed and the cut bone surfaces prepared with pulsatile lavage. Cement is mixed and once ready for implantation, the size 3 tibial implant, size  3 posterior stabilized femoral component, and the size 35 patella are cemented in place and the patella is held with the clamp. The trial insert is placed and the knee held in full extension. The Exparel (20 ml mixed with 50 ml saline) is injected into the extensor mechanism, posterior capsule, medial and lateral gutters and subcutaneous tissues.  All extruded cement is removed and once the cement is hard the permanent 10 mm posterior stabilized rotating platform insert is placed into the tibial tray.      The wound is copiously irrigated with saline solution and the extensor mechanism closed over a hemovac drain with #1 PDS suture. The tourniquet is released for a total tourniquet time of 30  minutes. Flexion against gravity is 140 degrees and the patella tracks normally. Subcutaneous tissue is closed with 2.0 vicryl and subcuticular with running 4.0 Monocryl. The incision is cleaned and dried and steri-strips and a bulky sterile dressing are applied. The limb is placed into a knee immobilizer and the patient is awakened and transported to recovery in stable condition.      Please note that a surgical assistant was a medical necessity for this procedure in order to perform it in a safe and expeditious manner. Surgical assistant was necessary to retract the ligaments and vital neurovascular structures to prevent injury to them and also necessary for proper positioning of the limb to allow for anatomic placement of the prosthesis.   Gus Rankin Xareni Kelch, MD    05/10/2012, 1:51 PM

## 2012-05-10 NOTE — Transfer of Care (Signed)
Immediate Anesthesia Transfer of Care Note  Patient: Dorothy Lawson  Procedure(s) Performed: Procedure(s): TOTAL KNEE ARTHROPLASTY (Left)  Patient Location: PACU  Anesthesia Type:Spinal  Level of Consciousness: awake, alert , oriented and patient cooperative  Airway & Oxygen Therapy: Patient Spontanous Breathing and Patient connected to face mask oxygen  Post-op Assessment: Report given to PACU RN and Post -op Vital signs reviewed and stable  Post vital signs: Reviewed and stable  Complications: No apparent anesthesia complications

## 2012-05-11 ENCOUNTER — Encounter (HOSPITAL_COMMUNITY): Payer: Self-pay | Admitting: Orthopedic Surgery

## 2012-05-11 DIAGNOSIS — E871 Hypo-osmolality and hyponatremia: Secondary | ICD-10-CM

## 2012-05-11 DIAGNOSIS — D62 Acute posthemorrhagic anemia: Secondary | ICD-10-CM

## 2012-05-11 LAB — CBC
MCH: 30.9 pg (ref 26.0–34.0)
MCHC: 34 g/dL (ref 30.0–36.0)
MCV: 90.8 fL (ref 78.0–100.0)
Platelets: 199 10*3/uL (ref 150–400)
RDW: 12.7 % (ref 11.5–15.5)

## 2012-05-11 LAB — BASIC METABOLIC PANEL
CO2: 29 mEq/L (ref 19–32)
Calcium: 8.5 mg/dL (ref 8.4–10.5)
Creatinine, Ser: 1 mg/dL (ref 0.50–1.10)
GFR calc Af Amer: 68 mL/min — ABNORMAL LOW (ref >90)
GFR calc non Af Amer: 59 mL/min — ABNORMAL LOW (ref >90)

## 2012-05-11 MED ORDER — POLYSACCHARIDE IRON COMPLEX 150 MG PO CAPS
150.0000 mg | ORAL_CAPSULE | Freq: Every day | ORAL | Status: DC
  Administered 2012-05-11 – 2012-05-13 (×3): 150 mg via ORAL
  Filled 2012-05-11 (×4): qty 1

## 2012-05-11 NOTE — Care Management Note (Signed)
    Page 1 of 2   05/14/2012     3:46:42 PM   CARE MANAGEMENT NOTE 05/14/2012  Patient:  Dorothy Lawson, Dorothy Lawson   Account Number:  [id number]  Date Initiated:  05/11/2012  Documentation initiated by:  Colleen Can  Subjective/Objective Assessment:   dx ostreoarthritis left knee; total knee replacemnt     Action/Plan:   Cm spoke with patient. Plans are for her to return to her home in [facility] where spouse and daughter will be caregivers. She already has RW, commode seat , shower chair and adaptive equipment.  She is requesting [facility] & wants specific PTherap   Anticipated DC Date:  05/13/2012   Anticipated DC Plan:  HOME W HOME HEALTH SERVICES      DC Planning Services  CM consult      [facility] Choice  HOME HEALTH   Choice offered to / List presented to:  C-1 Patient        HH arranged  HH-2 PT      [facility] agency  [facility]   Status of service:  Completed, signed off Medicare Important Message given?   (If response is "NO", the following Medicare IM given date fields will be blank) Date Medicare IM given:   Date Additional Medicare IM given:    Discharge Disposition:    Per UR Regulation:  Reviewed for med. necessity/level of care/duration of stay  If discussed at Long Length of Stay Meetings, dates discussed:    Comments:  05/14/2012 Colleen Can BSN RN CCM [phone] Pt discharged 04/10/ with ahc in place/services to start 05/14/2012   05/11/2012 Colleen Can BSN CCM [phone] [facility] notified and has advised that they can provide [facility] services for patient and will request specific therapist.

## 2012-05-11 NOTE — Evaluation (Signed)
Occupational Therapy Evaluation Patient Details Name: Dorothy Lawson MRN: [id number] DOB: 06-27-1950 Today's Date: 05/11/2012 Time: 1478-2956 OT Time Calculation (min): 55 min  OT Assessment / Plan / Recommendation Clinical Impression  This 62 year old female is s/p L TKA.  She will benefit from continued OT to finish education about bathroom transfers and ADLs,.  Do not anticipate she will need follow up OT post acute    OT Assessment  Patient needs continued OT Services    Follow Up Recommendations  No OT follow up    Barriers to Discharge      Equipment Recommendations  None recommended by OT    Recommendations for Other Services    Frequency  Min 2X/week    Precautions / Restrictions Precautions Precautions: Knee Required Braces or Orthoses: Knee Immobilizer - Left Knee Immobilizer - Left: Discontinue once straight leg raise with < 10 degree lag Restrictions Weight Bearing Restrictions: No RLE Weight Bearing: Weight bearing as tolerated   Pertinent Vitals/Pain No c/o pain    ADL  Grooming: Wash/dry hands;Wash/dry face;Set up Where Assessed - Grooming: Unsupported sitting Upper Body Bathing: Set up Where Assessed - Upper Body Bathing: Unsupported sitting Lower Body Bathing: Minimal assistance (with AE) Where Assessed - Lower Body Bathing: Supported sit to stand Upper Body Dressing: Set up Where Assessed - Upper Body Dressing: Unsupported sitting Lower Body Dressing: Moderate assistance (with AE) Where Assessed - Lower Body Dressing: Supported sit to stand Toilet Transfer: Minimal assistance Statistician Method: Surveyor, minerals: Materials engineer and Hygiene: Min guard Where Assessed - Engineer, mining and Hygiene: Standing Equipment Used: Rolling walker Transfers/Ambulation Related to ADLs: only performed SPT to 3;1 then back to bed.  Pt initially felt a little lightheaded.  She became  nauseas and vomiting during session.  Felt better after; RN aware ADL Comments: Practiced with AE as pt has this.  Used reacher to remove socks and sock aid. Pt anxious and wanted to write down all steps, especially with mobility.  She also wanted to see the KI applied a couple of times.  Will return to address shower tomorrow.    OT Diagnosis: Generalized weakness  OT Problem List: Decreased strength;Decreased activity tolerance;Decreased knowledge of use of DME or AE OT Treatment Interventions: Self-care/ADL training;DME and/or AE instruction;Patient/family education   OT Goals Acute Rehab OT Goals OT Goal Formulation: With patient Time For Goal Achievement: 05/18/12 Potential to Achieve Goals: Good ADL Goals Pt Will Transfer to Toilet: with supervision;Ambulation;3-in-1 (and complete all aspects of toileting with supervision) ADL Goal: Toilet Transfer - Progress: Goal set today Pt Will Perform Tub/Shower Transfer: Shower transfer;with min assist;Ambulation (min guard; 3:1 in shower) ADL Goal: Tub/Shower Transfer - Progress: Goal set today Miscellaneous OT Goals Miscellaneous OT Goal #1: Pt will be independent with ADL AE OT Goal: Miscellaneous Goal #1 - Progress: Goal set today  Visit Information  Last OT Received On: 05/11/12 Assistance Needed: +1    Subjective Data  Subjective: This is much harder after having surgery (sock aid) Patient Stated Goal: get back to being independent and walk better--pt could not extend LLE prior to sx   Prior Functioning     Home Living Lives With: Spouse Available Help at Discharge: Family;Available 24 hours/day Type of Home: House Home Access: Stairs to enter [employer] of Steps: 3 Entrance Stairs-Rails: None Home Layout: One level Bathroom Shower/Tub: Walk-in Contractor: Handicapped height Home Adaptive Equipment: Bedside commode/3-in-1;Straight cane;Walker - rolling;Sock  aid;Reacher Prior  Function Level of Independence: Independent with assistive device(s) Able to Take Stairs?: Yes Driving: Yes Communication Communication: No difficulties Dominant Hand: Left         Vision/Perception     Cognition  Cognition Overall Cognitive Status: Appears within functional limits for tasks assessed/performed Arousal/Alertness: Awake/alert Orientation Level: Appears intact for tasks assessed Behavior During Session: [facility] for tasks performed Cognition - Other Comments: anxious--trying to write down everything I said.  Assured we will review tomorrow and write down anything she needed.  Also looked in pt handbook and cross referenced    Extremity/Trunk Assessment Right Upper Extremity Assessment RUE ROM/Strength/Tone: [facility] for tasks assessed Left Upper Extremity Assessment LUE ROM/Strength/Tone: [facility] for tasks assessed  Trunk Assessment Trunk Assessment: Normal     Mobility Bed Mobility Bed Mobility: Supine to Sit Supine to Sit: HOB elevated;With rails Sit to Supine: 4: Min assist Details for Bed Mobility Assistance: assisted LLE  Transfers Sit to Stand: 4: Min assist;With armrests;From bed;From chair/3-in-1 Stand to Sit: 4: Min assist;With upper extremity assist;With armrests;To chair/3-in-1 Details for Transfer Assistance: assist to rise and steady; cues for hand and LE placement     Exercise    Balance     End of Session OT - End of Session Activity Tolerance: Patient tolerated treatment well Patient left: in bed;with call bell/phone within reach;with nursing in room  GO     Jerzi Tigert 05/11/2012, 2:41 PM [name], OTR/L [phone] 05/11/2012

## 2012-05-11 NOTE — Evaluation (Signed)
Physical Therapy Evaluation Patient Details Name: Dorothy Lawson MRN: [id number] DOB: [birth year] Today's Date: 05/11/2012 Time: 4098-1191 PT Time Calculation (min): 52 min  PT Assessment / Plan / Recommendation Clinical Impression  Pt presents s/p L TKA POD 1 with decreased strength, ROM and mobility.  Tolerated OOB and ambulation to/from restroom well, however with very slow gait speed and many questions regarding mobility.  Pt will benefit from skilled PT in acute venue to address deficits.  PT recommends HHPT for follow up at D/C to maximize pts safety and function.     PT Assessment  Patient needs continued PT services    Follow Up Recommendations  Home health PT    Does the patient have the potential to tolerate intense rehabilitation      Barriers to Discharge None      Equipment Recommendations  None recommended by PT    Recommendations for Other Services OT consult   Frequency 7X/week    Precautions / Restrictions Precautions Precautions: Knee Required Braces or Orthoses: Knee Immobilizer - Left Knee Immobilizer - Left: Discontinue once straight leg raise with < 10 degree lag Restrictions Weight Bearing Restrictions: No RLE Weight Bearing: Weight bearing as tolerated   Pertinent Vitals/Pain 4/10, ice packs applied, pain meds given during session.       Mobility  Bed Mobility Bed Mobility: Supine to Sit Supine to Sit: HOB elevated;With rails Details for Bed Mobility Assistance: Assist for LLE out of bed with mod cues for hand placement on bed to self assist.   Transfers Transfers: Sit to Stand;Stand to Sit Sit to Stand: 4: Min assist;3: Mod assist;From elevated surface;With upper extremity assist;From bed Stand to Sit: 4: Min assist;With upper extremity assist;With armrests;To chair/3-in-1 Details for Transfer Assistance: Assist to rise and steady with cues for hand placement and LE management when sitting/standing.  Performed x 2 in order to use 3in1 in  restroom.  Ambulation/Gait Ambulation/Gait Assistance: 4: Min assist Ambulation Distance (Feet): 20 Feet (x2) Assistive device: Rolling walker Ambulation/Gait Assistance Details: Cues for sequencing/technique with RW, maintaining upright posture and increasing stride length as pt initially took very small steps.   Gait Pattern: Step-to pattern;Decreased stride length;Antalgic;Trunk flexed Gait velocity: very slow gait speed.  Stairs: No Wheelchair Mobility Wheelchair Mobility: No    Exercises Total Joint Exercises Ankle Circles/Pumps: AROM;Both;20 reps;Seated Quad Sets: Left;10 reps;Seated (with towel under ankle.) Straight Leg Raises: AAROM;Left;10 reps;Seated   PT Diagnosis: Difficulty walking;Generalized weakness;Acute pain  PT Problem List: Decreased strength;Decreased range of motion;Decreased activity tolerance;Decreased balance;Decreased mobility;Decreased knowledge of use of DME;Decreased safety awareness;Decreased knowledge of precautions;Pain PT Treatment Interventions: DME instruction;Gait training;Stair training;Functional mobility training;Therapeutic activities;Therapeutic exercise;Balance training;Patient/family education   PT Goals Acute Rehab PT Goals PT Goal Formulation: With patient Time For Goal Achievement: 05/11/12 Potential to Achieve Goals: Good Pt will go Supine/Side to Sit: with supervision PT Goal: Supine/Side to Sit - Progress: Goal set today Pt will go Sit to Supine/Side: with supervision PT Goal: Sit to Supine/Side - Progress: Goal set today Pt will go Sit to Stand: with supervision PT Goal: Sit to Stand - Progress: Goal set today Pt will Ambulate: 51 - 150 feet;with supervision;with least restrictive assistive device PT Goal: Ambulate - Progress: Goal set today Pt will Go Up / Down Stairs: 3-5 stairs;with min assist;with least restrictive assistive device PT Goal: Up/Down Stairs - Progress: Goal set today Pt will Perform Home Exercise Program: with  supervision, verbal cues required/provided PT Goal: Perform Home Exercise Program - Progress: Goal set  today  Visit Information  Last PT Received On: 05/11/12 Assistance Needed: +1    Subjective Data  Subjective: I hope I can remember all of this.  Patient Stated Goal: to return home.    Prior Functioning  Home Living Lives With: Spouse Available Help at Discharge: Family;Available 24 hours/day Type of Home: House Home Access: Stairs to enter [employer] of Steps: 3 Entrance Stairs-Rails: None Home Layout: One level Bathroom Shower/Tub: Walk-in Contractor: Handicapped height Home Adaptive Equipment: Bedside commode/3-in-1;Straight cane;Walker - rolling;Sock aid;Reacher Prior Function Level of Independence: Independent with assistive device(s) Able to Take Stairs?: Yes Driving: Yes    Cognition  Cognition Overall Cognitive Status: Appears within functional limits for tasks assessed/performed Arousal/Alertness: Awake/alert Orientation Level: Appears intact for tasks assessed Behavior During Session: [facility] for tasks performed    Extremity/Trunk Assessment Right Lower Extremity Assessment RLE ROM/Strength/Tone: [facility] for tasks assessed RLE Sensation: WFL - Light Touch Left Lower Extremity Assessment LLE ROM/Strength/Tone: Deficits LLE ROM/Strength/Tone Deficits: ankle motions WFL, unable to perform SLR without assist.  LLE Sensation: WFL - Light Touch Trunk Assessment Trunk Assessment: Normal   Balance    End of Session PT - End of Session Equipment Utilized During Treatment: Gait belt;Left knee immobilizer Activity Tolerance: Patient limited by fatigue Patient left: in chair;with call bell/phone within reach Nurse Communication: Mobility status  GP     Vista Deck 05/11/2012, 1:27 PM

## 2012-05-11 NOTE — Progress Notes (Signed)
Physical Therapy Treatment Patient Details Name: Dorothy Lawson MRN: [id number] DOB: 1950-05-22 Today's Date: 05/11/2012 Time: 4098-1191 PT Time Calculation (min): 48 min  PT Assessment / Plan / Recommendation Comments on Treatment Session  Pt progressing with mobility, however continues to have VERY slow gait speed and seems somewhat anxious about getting all details written down and correct.     Follow Up Recommendations  Home health PT     Does the patient have the potential to tolerate intense rehabilitation     Barriers to Discharge        Equipment Recommendations  None recommended by PT    Recommendations for Other Services    Frequency 7X/week   Plan Discharge plan remains appropriate    Precautions / Restrictions Precautions Precautions: Knee Required Braces or Orthoses: Knee Immobilizer - Left Knee Immobilizer - Left: Discontinue once straight leg raise with < 10 degree lag Restrictions Weight Bearing Restrictions: No RLE Weight Bearing: Weight bearing as tolerated   Pertinent Vitals/Pain Pt with some c/o pain.  RN provided pain meds.     Mobility  Bed Mobility Bed Mobility: Supine to Sit;Sit to Supine Supine to Sit: 4: Min assist;HOB flat Sit to Supine: 4: Min assist Details for Bed Mobility Assistance: Assist for LLE into and out of bed with cues for hand placement/technique.  Transfers Transfers: Sit to Stand;Stand to Sit Sit to Stand: 4: Min assist;From bed;4: Min guard Stand to Sit: 4: Min assist;With upper extremity assist;To bed Details for Transfer Assistance: Cues for hand placement and LE management.  Ambulation/Gait Ambulation/Gait Assistance: 4: Min assist Ambulation Distance (Feet): 60 Feet Assistive device: Rolling walker Ambulation/Gait Assistance Details: Cues for sequencing/technique with RW and to maintain upright posture.  Pt somewhat anxious about getting all details right with ambulation and mobility in general.  Gait Pattern:  Step-to pattern;Decreased stride length;Antalgic;Trunk flexed Gait velocity: very slow gait speed.     Exercises Total Joint Exercises Ankle Circles/Pumps: AROM;Both;20 reps Quad Sets: AROM;Left;10 reps Heel Slides: AAROM;Left;10 reps Hip ABduction/ADduction: AAROM;Left;10 reps Straight Leg Raises: AAROM;Left;10 reps   PT Diagnosis:    PT Problem List:   PT Treatment Interventions:     PT Goals Acute Rehab PT Goals PT Goal Formulation: With patient Time For Goal Achievement: 05/11/12 Potential to Achieve Goals: Good Pt will go Supine/Side to Sit: with supervision PT Goal: Supine/Side to Sit - Progress: Progressing toward goal Pt will go Sit to Supine/Side: with supervision PT Goal: Sit to Supine/Side - Progress: Progressing toward goal Pt will go Sit to Stand: with supervision PT Goal: Sit to Stand - Progress: Progressing toward goal Pt will Ambulate: 51 - 150 feet;with supervision;with least restrictive assistive device PT Goal: Ambulate - Progress: Progressing toward goal Pt will Perform Home Exercise Program: with supervision, verbal cues required/provided PT Goal: Perform Home Exercise Program - Progress: Progressing toward goal  Visit Information  Last PT Received On: 05/11/12 Assistance Needed: +1    Subjective Data  Subjective: How far do you want me to walk.  Patient Stated Goal: to return home.    Cognition  Cognition Overall Cognitive Status: Appears within functional limits for tasks assessed/performed Arousal/Alertness: Awake/alert Orientation Level: Appears intact for tasks assessed Behavior During Session: [facility] for tasks performed Cognition - Other Comments: anxious--trying to write down everything I said.  Assured we will review tomorrow and write down anything she needed.  Also looked in pt handbook and cross referenced    Balance     End of Session PT -  End of Session Equipment Utilized During Treatment: Gait belt;Left knee immobilizer Activity  Tolerance: Patient tolerated treatment well Patient left: in bed;with call bell/phone within reach Nurse Communication: Mobility status   GP     Vista Deck 05/11/2012, 5:57 PM

## 2012-05-11 NOTE — Progress Notes (Signed)
   Subjective: 1 Day Post-Op Procedure(s) (LRB): TOTAL KNEE ARTHROPLASTY (Left) Patient reports pain as mild.   Patient seen in rounds with Dr. Lequita Halt. Patient is well, and has had no acute complaints or problems We will start therapy today.  Plan is to go Home after hospital stay.  Objective: Vital signs in last 24 hours: Temp:  [96.6 F (35.9 C)-98.5 F (36.9 C)] 98.2 F (36.8 C) (04/08 0653) Pulse Rate:  [49-97] 97 (04/08 0653) Resp:  [9-20] 14 (04/08 0653) BP: (101-155)/(57-79) 101/64 mmHg (04/08 0653) SpO2:  [99 %-100 %] 100 % (04/08 0653) Weight:  [72.576 kg (160 lb)-73.576 kg (162 lb 3.3 oz)] 73.576 kg (162 lb 3.3 oz) (04/07 1718)  Intake/Output from previous day:  Intake/Output Summary (Last 24 hours) at 05/11/12 0916 Last data filed at 05/11/12 0656  Gross per 24 hour  Intake   3740 ml  Output   2070 ml  Net   1670 ml    Intake/Output this shift: UOP 300 since MN +1670  Labs:  Recent Labs  05/11/12 0500  HGB 9.7*    Recent Labs  05/11/12 0500  WBC 8.5  RBC 3.14*  HCT 28.5*  PLT 199    Recent Labs  05/11/12 0500  NA 132*  K 3.9  CL 99  CO2 29  BUN 16  CREATININE 1.00  GLUCOSE 156*  CALCIUM 8.5   No results found for this basename: LABPT, INR,  in the last 72 hours  EXAM General - Patient is Alert, Appropriate and Oriented Extremity - Neurovascular intact Sensation intact distally Dorsiflexion/Plantar flexion intact Dressing - dressing C/D/I Motor Function - intact, moving foot and toes well on exam.  Hemovac pulled without difficulty.  Past Medical History  Diagnosis Date  . Hypertension   . Hypercholesterolemia 04-15-12    tx. meds  . GERD (gastroesophageal reflux disease)     tx. Prilosec  . Arthritis 05-03-12    osteoarthritis - knees    Assessment/Plan: 1 Day Post-Op Procedure(s) (LRB): TOTAL KNEE ARTHROPLASTY (Left) Principal Problem:   OA (osteoarthritis) of knee Active Problems:   Postoperative anemia due to  acute blood loss   Hyponatremia  Estimated body mass index is 25.4 kg/(m^2) as calculated from the following:   Height as of this encounter: 5\' 7"  (1.702 m).   Weight as of this encounter: 73.576 kg (162 lb 3.3 oz). Advance diet Up with therapy Discharge home with home health  DVT Prophylaxis - Xarelto, ASA 81 mg on hold Weight-Bearing as tolerated to left leg No vaccines. D/C O2 and Pulse OX and try on Room [address]  Patrica Duel 05/11/2012, 9:16 AM

## 2012-05-12 LAB — CBC
MCV: 88.6 fL (ref 78.0–100.0)
Platelets: 210 10*3/uL (ref 150–400)
RDW: 12.6 % (ref 11.5–15.5)
WBC: 14.2 10*3/uL — ABNORMAL HIGH (ref 4.0–10.5)

## 2012-05-12 LAB — BASIC METABOLIC PANEL
Chloride: 94 mEq/L — ABNORMAL LOW (ref 96–112)
Creatinine, Ser: 0.94 mg/dL (ref 0.50–1.10)
GFR calc Af Amer: 74 mL/min — ABNORMAL LOW (ref >90)
Sodium: 131 mEq/L — ABNORMAL LOW (ref 135–145)

## 2012-05-12 NOTE — Progress Notes (Signed)
   Subjective: 2 Days Post-Op Procedure(s) (LRB): TOTAL KNEE ARTHROPLASTY (Left) Patient reports pain as mild.   Patient seen in rounds with Dr. Lequita Halt.  She had nausea and vomiting yesterday.  Will see how she does today. Patient is well, and has had no acute complaints or problems Plan is to go Home after hospital stay.  Objective: Vital signs in last 24 hours: Temp:  [98.3 F (36.8 C)-98.7 F (37.1 C)] 98.7 F (37.1 C) (04/09 1400) Pulse Rate:  [73-96] 80 (04/09 1400) Resp:  [14-16] 15 (04/09 1400) BP: (107-122)/(61-71) 120/69 mmHg (04/09 1400) SpO2:  [95 %-99 %] 99 % (04/09 1400)  Intake/Output from previous day:  Intake/Output Summary (Last 24 hours) at 05/12/12 1432 Last data filed at 05/12/12 1402  Gross per 24 hour  Intake    480 ml  Output   4500 ml  Net  -4020 ml    Intake/Output this shift: Total I/O In: 240 [P.O.:240] Out: 400 [Urine:400]  Labs:  Recent Labs  05/11/12 0500 05/12/12 0435  HGB 9.7* 9.4*    Recent Labs  05/11/12 0500 05/12/12 0435  WBC 8.5 14.2*  RBC 3.14* 2.99*  HCT 28.5* 26.5*  PLT 199 210    Recent Labs  05/11/12 0500 05/12/12 0435  NA 132* 131*  K 3.9 3.7  CL 99 94*  CO2 29 31  BUN 16 13  CREATININE 1.00 0.94  GLUCOSE 156* 125*  CALCIUM 8.5 9.2   No results found for this basename: LABPT, INR,  in the last 72 hours  EXAM General - Patient is Alert, Appropriate and Oriented Extremity - Neurovascular intact Sensation intact distally Dorsiflexion/Plantar flexion intact No cellulitis present Dressing/Incision - clean, dry, no drainage, healing Motor Function - intact, moving foot and toes well on exam.   Past Medical History  Diagnosis Date  . Hypertension   . Hypercholesterolemia 04-15-12    tx. meds  . GERD (gastroesophageal reflux disease)     tx. Prilosec  . Arthritis 05-03-12    osteoarthritis - knees    Assessment/Plan: 2 Days Post-Op Procedure(s) (LRB): TOTAL KNEE ARTHROPLASTY (Left) Principal  Problem:   OA (osteoarthritis) of knee Active Problems:   Postoperative anemia due to acute blood loss   Hyponatremia  Estimated body mass index is 25.4 kg/(m^2) as calculated from the following:   Height as of this encounter: 5\' 7"  (1.702 m).   Weight as of this encounter: 73.576 kg (162 lb 3.3 oz). Up with therapy Plan for discharge tomorrow Discharge home with home health  DVT Prophylaxis - Xarelto, ASA 81 mg on hold Weight-Bearing as tolerated to left leg  PERKINS, ALEXZANDREW 05/12/2012, 2:32 PM

## 2012-05-12 NOTE — Progress Notes (Signed)
Occupational Therapy Treatment Patient Details Name: Dorothy Lawson MRN: [id number] DOB: 1950/07/02 Today's Date: 05/12/2012 Time: 1191-4782 OT Time Calculation (min): 43 min  OT Assessment / Plan / Recommendation Comments on Treatment Session      Follow Up Recommendations  No OT follow up    Barriers to Discharge       Equipment Recommendations  None recommended by OT    Recommendations for Other Services    Frequency Min 2X/week   Plan      Precautions / Restrictions Precautions Precautions: Knee Required Braces or Orthoses: Knee Immobilizer - Left Knee Immobilizer - Left: Discontinue once straight leg raise with < 10 degree lag Restrictions Weight Bearing Restrictions: No RLE Weight Bearing: Weight bearing as tolerated   Pertinent Vitals/Pain LLE sore--repositioned and reapplied ice    ADL  Toilet Transfer: Min guard Toilet Transfer Method: Sit to Barista: Raised toilet seat with arms (or 3-in-1 over toilet) Toileting - Clothing Manipulation and Hygiene: Supervision/safety (once standing; min guard for sit to stand) Where Assessed - Engineer, mining and Hygiene: Standing Tub/Shower Transfer: Min guard Tub/Shower Transfer Method: Science writer: Walk in Scientist, research (physical sciences) Used: Rolling walker Transfers/Ambulation Related to ADLs: ambulated to bathroom; pt needed min cues for sequence.  Able to identify correct hand and leg position with min cues.  Pt still slightly anxious about doing everything correctly.  Typed out "cheat sheet" with steps.  Gave her shower handout.  reviewed positioning for legs.  Pt would like to review one more time prior to discharge    OT Diagnosis:    OT Problem List:   OT Treatment Interventions:     OT Goals Acute Rehab OT Goals Time For Goal Achievement: 05/18/12 ADL Goals Pt Will Transfer to Toilet: with supervision;Ambulation;3-in-1 ADL Goal: Toilet Transfer -  Progress: Progressing toward goals Pt Will Perform Tub/Shower Transfer: Shower transfer;Ambulation;with supervision ADL Goal: Tub/Shower Transfer - Progress: Updated due to goal met  Visit Information  Last OT Received On: 05/12/12 Assistance Needed: +1    Subjective Data      Prior Functioning       Cognition  Cognition Overall Cognitive Status: Appears within functional limits for tasks assessed/performed Behavior During Session: [facility] for tasks performed Cognition - Other Comments: anxiety decreasing    Mobility  Bed Mobility Supine to Sit: 4: Min assist;HOB flat Details for Bed Mobility Assistance: min cues, assist for LLE Transfers Sit to Stand: 4: Min guard;From chair/3-in-1;With armrests;From bed;With upper extremity assist Details for Transfer Assistance: occasional min cues for hand/leg position (performed 3xs)    Exercises      Balance     End of Session OT - End of Session Activity Tolerance: Patient tolerated treatment well Patient left: in chair;with call bell/phone within reach CPM Left Knee CPM Left Knee: Off  GO     Dorothy Lawson 05/12/2012, 9:58 AM [name], OTR/L [phone] 05/12/2012

## 2012-05-12 NOTE — Progress Notes (Signed)
Physical Therapy Treatment Patient Details Name: Dorothy Lawson MRN: [id number] DOB: 12/05/1950 Today's Date: 05/12/2012 Time: 2841-3244 PT Time Calculation (min): 54 min  PT Assessment / Plan / Recommendation Comments on Treatment Session  Pt continues to progress with mobility, but with increased confusion this afternoon.  She continues to be very detail oriented and wants to know and write down all details.  Needs to practice stairs in am before D/c.     Follow Up Recommendations  Home health PT     Does the patient have the potential to tolerate intense rehabilitation     Barriers to Discharge        Equipment Recommendations  None recommended by PT    Recommendations for Other Services    Frequency 7X/week   Plan Discharge plan remains appropriate    Precautions / Restrictions Precautions Precautions: Knee Required Braces or Orthoses: Knee Immobilizer - Left Knee Immobilizer - Left: Discontinue once straight leg raise with < 10 degree lag Restrictions Weight Bearing Restrictions: No RLE Weight Bearing: Weight bearing as tolerated   Pertinent Vitals/Pain 6/10 pain, pt premedicated    Mobility  Bed Mobility Bed Mobility: Supine to Sit;Sit to Supine Supine to Sit: 4: Min assist;HOB flat Sit to Supine: 4: Min assist Details for Bed Mobility Assistance: Assist for LLE into and out of bed with cues for hand placement and technique.  Again pt with some confusion when getting into bed.  she seemed to think that it was exercise.   Transfers Transfers: Sit to Stand;Stand to Sit Sit to Stand: 4: Min guard;From chair/3-in-1;With armrests;From bed;With upper extremity assist Stand to Sit: 4: Min assist;With upper extremity assist;To bed;To chair/3-in-1 Details for Transfer Assistance: Performed x 2 in order to use 3in1 in restroom.  Continues to require min cues for hand placement LE management (more so this afternoon due to confusion).  Ambulation/Gait Ambulation/Gait  Assistance: 4: Min guard Ambulation Distance (Feet): 120 Feet Assistive device: Rolling walker Ambulation/Gait Assistance Details: min cues for upright and relaxed posture and equal step lengths.  Gait Pattern: Step-to pattern;Decreased stride length;Antalgic;Trunk flexed Gait velocity: very slow gait speed.     Exercises Total Joint Exercises Ankle Circles/Pumps: AROM;Both;20 reps Quad Sets: AROM;Left;10 reps Heel Slides: AAROM;Left;10 reps Hip ABduction/ADduction: AAROM;Left;10 reps Straight Leg Raises: AAROM;Left;10 reps   PT Diagnosis:    PT Problem List:   PT Treatment Interventions:     PT Goals Acute Rehab PT Goals PT Goal Formulation: With patient Time For Goal Achievement: 05/11/12 Potential to Achieve Goals: Good Pt will go Supine/Side to Sit: with supervision PT Goal: Supine/Side to Sit - Progress: Progressing toward goal Pt will go Sit to Supine/Side: with supervision PT Goal: Sit to Supine/Side - Progress: Progressing toward goal Pt will go Sit to Stand: with supervision PT Goal: Sit to Stand - Progress: Progressing toward goal Pt will Ambulate: 51 - 150 feet;with supervision;with least restrictive assistive device PT Goal: Ambulate - Progress: Progressing toward goal Pt will Perform Home Exercise Program: with supervision, verbal cues required/provided PT Goal: Perform Home Exercise Program - Progress: Progressing toward goal  Visit Information  Last PT Received On: 05/12/12 Assistance Needed: +1    Subjective Data  Subjective: Can I just make sure I have all the exercises down? Patient Stated Goal: to return home.    Cognition  Cognition Overall Cognitive Status: Impaired Area of Impairment: Memory Arousal/Alertness: Awake/alert Orientation Level: Appears intact for tasks assessed Behavior During Session: [facility] for tasks performed Memory: Decreased recall of  precautions Cognition - Other Comments: Pt with some increased confusion this afternoon.  RN  notified and stated that she would monitor and give less pain meds.      Balance     End of Session PT - End of Session Equipment Utilized During Treatment: Left knee immobilizer Activity Tolerance: Patient tolerated treatment well Patient left: in bed;with call bell/phone within reach Nurse Communication: Mobility status (confusion)   GP     Marya Amsler Meribeth Mattes 05/12/2012, 5:33 PM

## 2012-05-13 LAB — CBC
HCT: 25.3 % — ABNORMAL LOW (ref 36.0–46.0)
Hemoglobin: 8.7 g/dL — ABNORMAL LOW (ref 12.0–15.0)
MCV: 90.4 fL (ref 78.0–100.0)
Platelets: 217 10*3/uL (ref 150–400)
RBC: 2.8 MIL/uL — ABNORMAL LOW (ref 3.87–5.11)
WBC: 11 10*3/uL — ABNORMAL HIGH (ref 4.0–10.5)

## 2012-05-13 LAB — BASIC METABOLIC PANEL
BUN: 16 mg/dL (ref 6–23)
CO2: 34 mEq/L — ABNORMAL HIGH (ref 19–32)
Chloride: 96 mEq/L (ref 96–112)
Creatinine, Ser: 1.04 mg/dL (ref 0.50–1.10)

## 2012-05-13 MED ORDER — RIVAROXABAN 10 MG PO TABS: 10.0000 mg | ORAL_TABLET | Freq: Every day | ORAL | Status: DC

## 2012-05-13 MED ORDER — OXYCODONE HCL 5 MG PO TABS: 5.0000 mg | ORAL_TABLET | ORAL | Status: DC | PRN

## 2012-05-13 MED ORDER — POLYSACCHARIDE IRON COMPLEX 150 MG PO CAPS: 150.0000 mg | ORAL_CAPSULE | Freq: Every day | ORAL | Status: DC

## 2012-05-13 MED ORDER — TRAMADOL HCL 50 MG PO TABS: 50.0000 mg | ORAL_TABLET | Freq: Four times a day (QID) | ORAL | Status: DC | PRN

## 2012-05-13 MED ORDER — METHOCARBAMOL 500 MG PO TABS: 500.0000 mg | ORAL_TABLET | Freq: Four times a day (QID) | ORAL | Status: DC | PRN

## 2012-05-13 NOTE — Progress Notes (Signed)
Occupational Therapy Treatment Patient Details Name: Dorothy Lawson MRN: [id number] DOB: 1950-06-10 Today's Date: 05/13/2012 Time: 4098-1191 OT Time Calculation (min): 23 min  OT Assessment / Plan / Recommendation Comments on Treatment Session      Follow Up Recommendations  No OT follow up    Barriers to Discharge       Equipment Recommendations  None recommended by OT    Recommendations for Other Services    Frequency Min 2X/week   Plan      Precautions / Restrictions Precautions Precautions: Knee Required Braces or Orthoses: Knee Immobilizer - Left Knee Immobilizer - Left: Discontinue once straight leg raise with < 10 degree lag Restrictions RLE Weight Bearing: Weight bearing as tolerated   Pertinent Vitals/Pain LLE tender initially, felt better when she beared weight.  Repositioned in chair with ice    ADL  Toilet Transfer: Supervision/safety Toilet Transfer Method: Sit to stand Toilet Transfer Equipment: Raised toilet seat with arms (or 3-in-1 over toilet) Toileting - Clothing Manipulation and Hygiene: Supervision/safety Where Assessed - Glass blower/designer Manipulation and Hygiene: Standing Tub/Shower Transfer: Therapist, sports Method: Science writer: Walk in shower Transfers/Ambulation Related to ADLs: pt did not need any cues for ambulating/transfer sequence ADL Comments: Pt reports she was able to don pants this am.  Husband will assist with socks    OT Diagnosis:    OT Problem List:   OT Treatment Interventions:     OT Goals ADL Goals Pt Will Transfer to Toilet: with supervision;Ambulation;3-in-1 ADL Goal: Toilet Transfer - Progress: Met Pt Will Perform Tub/Shower Transfer: Shower transfer;Ambulation;with supervision ADL Goal: Web designer - Progress: Met Miscellaneous OT Goals Miscellaneous OT Goal #1: Pt will be independent with ADL AE OT Goal: Miscellaneous Goal #1 - Progress: Other (comment)  (verbalizes)  Visit Information  Last OT Received On: 05/13/12 Assistance Needed: +1    Subjective Data      Prior Functioning       Cognition  Cognition Overall Cognitive Status: Appears within functional limits for tasks assessed/performed Behavior During Session: [facility] for tasks performed    Mobility  Transfers Sit to Stand: 5: Supervision;From chair/3-in-1;With upper extremity assist Stand to Sit: 5: Supervision;To chair/3-in-1;With armrests Details for Transfer Assistance: pt talked herself through steps.  Good safety    Exercises      Balance     End of Session OT - End of Session Activity Tolerance: Patient tolerated treatment well Patient left: in chair;with call bell/phone within reach  GO     [facility] 05/13/2012, 8:14 AM [name], OTR/L [phone] 05/13/2012

## 2012-05-13 NOTE — Discharge Summary (Signed)
Physician Discharge Summary   Patient ID: Dorothy Lawson MRN: [id number] DOB/AGE: March 21, 1950 62 y.o.  Admit date: 05/10/2012 Discharge date: 05/13/2012  Primary Diagnosis:  Osteoarthritis Left knee  Admission Diagnoses:  Past Medical History  Diagnosis Date  . Hypertension   . Hypercholesterolemia 04-15-12    tx. meds  . GERD (gastroesophageal reflux disease)     tx. Prilosec  . Arthritis 05-03-12    osteoarthritis - knees   Discharge Diagnoses:   Principal Problem:   OA (osteoarthritis) of knee Active Problems:   Postoperative anemia due to acute blood loss   Hyponatremia  Estimated body mass index is 25.4 kg/(m^2) as calculated from the following:   Height as of this encounter: 5\' 7"  (1.702 m).   Weight as of this encounter: 73.576 kg (162 lb 3.3 oz).  Procedure:  Procedure(s) (LRB): TOTAL KNEE ARTHROPLASTY (Left)   Consults: None  HPI: Dorothy Lawson is a 62 y.o. year old female with end stage OA of her left knee with progressively worsening pain and dysfunction. She has constant pain, with activity and at rest and significant functional deficits with difficulties even with ADLs. She has had extensive non-op management including analgesics, injections of cortisone and viscosupplements, and home exercise program, but remains in significant pain with significant dysfunction. Radiographs show bone on bone arthritis medial and patellofemoral. She presents now for left Total Knee Arthroplasty.   Laboratory Data: Admission on 05/10/2012  Component Date Value Range Status  . WBC 05/11/2012 8.5  4.0 - 10.5 K/uL Final  . RBC 05/11/2012 3.14* 3.87 - 5.11 MIL/uL Final  . Hemoglobin 05/11/2012 9.7* 12.0 - 15.0 g/dL Final  . HCT 08/65/7846 28.5* 36.0 - 46.0 % Final  . MCV 05/11/2012 90.8  78.0 - 100.0 fL Final  . MCH 05/11/2012 30.9  26.0 - 34.0 pg Final  . MCHC 05/11/2012 34.0  30.0 - 36.0 g/dL Final  . RDW 96/29/5284 12.7  11.5 - 15.5 % Final  . Platelets 05/11/2012 199   150 - 400 K/uL Final  . Sodium 05/11/2012 132* 135 - 145 mEq/L Final  . Potassium 05/11/2012 3.9  3.5 - 5.1 mEq/L Final  . Chloride 05/11/2012 99  96 - 112 mEq/L Final  . CO2 05/11/2012 29  19 - 32 mEq/L Final  . Glucose, Bld 05/11/2012 156* 70 - 99 mg/dL Final  . BUN 13/24/4010 16  6 - 23 mg/dL Final  . Creatinine, Ser 05/11/2012 1.00  0.50 - 1.10 mg/dL Final  . Calcium 27/25/3664 8.5  8.4 - 10.5 mg/dL Final  . GFR calc non Af Amer 05/11/2012 59* >90 mL/min Final  . GFR calc Af Amer 05/11/2012 68* >90 mL/min Final   Comment:                                 The eGFR has been calculated                          using the CKD EPI equation.                          This calculation has not been                          validated in all clinical  situations.                          eGFR's persistently                          <90 mL/min signify                          possible Chronic Kidney Disease.  . WBC 05/12/2012 14.2* 4.0 - 10.5 K/uL Final  . RBC 05/12/2012 2.99* 3.87 - 5.11 MIL/uL Final  . Hemoglobin 05/12/2012 9.4* 12.0 - 15.0 g/dL Final  . HCT 16/11/9602 26.5* 36.0 - 46.0 % Final  . MCV 05/12/2012 88.6  78.0 - 100.0 fL Final  . MCH 05/12/2012 31.4  26.0 - 34.0 pg Final  . MCHC 05/12/2012 35.5  30.0 - 36.0 g/dL Final  . RDW 54/10/8117 12.6  11.5 - 15.5 % Final  . Platelets 05/12/2012 210  150 - 400 K/uL Final  . Sodium 05/12/2012 131* 135 - 145 mEq/L Final  . Potassium 05/12/2012 3.7  3.5 - 5.1 mEq/L Final  . Chloride 05/12/2012 94* 96 - 112 mEq/L Final  . CO2 05/12/2012 31  19 - 32 mEq/L Final  . Glucose, Bld 05/12/2012 125* 70 - 99 mg/dL Final  . BUN 14/78/2956 13  6 - 23 mg/dL Final  . Creatinine, Ser 05/12/2012 0.94  0.50 - 1.10 mg/dL Final  . Calcium 21/30/8657 9.2  8.4 - 10.5 mg/dL Final  . GFR calc non Af Amer 05/12/2012 64* >90 mL/min Final  . GFR calc Af Amer 05/12/2012 74* >90 mL/min Final   Comment:                                 The  eGFR has been calculated                          using the CKD EPI equation.                          This calculation has not been                          validated in all clinical                          situations.                          eGFR's persistently                          <90 mL/min signify                          possible Chronic Kidney Disease.  . WBC 05/13/2012 11.0* 4.0 - 10.5 K/uL Final  . RBC 05/13/2012 2.80* 3.87 - 5.11 MIL/uL Final  . Hemoglobin 05/13/2012 8.7* 12.0 - 15.0 g/dL Final  . HCT 84/69/6295 25.3* 36.0 - 46.0 % Final  . MCV 05/13/2012 90.4  78.0 - 100.0 fL Final  . MCH 05/13/2012 31.1  26.0 - 34.0 pg Final  . MCHC 05/13/2012 34.4  30.0 -  36.0 g/dL Final  . RDW 40/98/1191 12.9  11.5 - 15.5 % Final  . Platelets 05/13/2012 217  150 - 400 K/uL Final  . Sodium 05/13/2012 134* 135 - 145 mEq/L Final  . Potassium 05/13/2012 3.9  3.5 - 5.1 mEq/L Final  . Chloride 05/13/2012 96  96 - 112 mEq/L Final  . CO2 05/13/2012 34* 19 - 32 mEq/L Final  . Glucose, Bld 05/13/2012 109* 70 - 99 mg/dL Final  . BUN 47/82/9562 16  6 - 23 mg/dL Final  . Creatinine, Ser 05/13/2012 1.04  0.50 - 1.10 mg/dL Final  . Calcium 13/09/6576 9.0  8.4 - 10.5 mg/dL Final  . GFR calc non Af Amer 05/13/2012 56* >90 mL/min Final  . GFR calc Af Amer 05/13/2012 65* >90 mL/min Final   Comment:                                 The eGFR has been calculated                          using the CKD EPI equation.                          This calculation has not been                          validated in all clinical                          situations.                          eGFR's persistently                          <90 mL/min signify                          possible Chronic Kidney Disease.  Hospital Outpatient Visit on 05/03/2012  Component Date Value Range Status  . MRSA, PCR 05/03/2012 NEGATIVE  NEGATIVE Final  . Staphylococcus aureus 05/03/2012 NEGATIVE  NEGATIVE Final   Comment:                                  The Xpert SA Assay (FDA                          approved for NASAL specimens                          in patients over 85 years of age),                          is one component of                          a comprehensive surveillance                          program.  Test performance has  been validated by [facility] for patients greater                          than or equal to 45 year old.                          It is not intended                          to diagnose infection nor to                          guide or monitor treatment.  Marland Kitchen aPTT 05/03/2012 29  24 - 37 seconds Final  . WBC 05/03/2012 5.5  4.0 - 10.5 K/uL Final  . RBC 05/03/2012 4.10  3.87 - 5.11 MIL/uL Final  . Hemoglobin 05/03/2012 12.7  12.0 - 15.0 g/dL Final  . HCT 96/05/5407 37.7  36.0 - 46.0 % Final  . MCV 05/03/2012 92.0  78.0 - 100.0 fL Final  . MCH 05/03/2012 31.0  26.0 - 34.0 pg Final  . MCHC 05/03/2012 33.7  30.0 - 36.0 g/dL Final  . RDW 81/19/1478 12.7  11.5 - 15.5 % Final  . Platelets 05/03/2012 293  150 - 400 K/uL Final  . Sodium 05/03/2012 139  135 - 145 mEq/L Final  . Potassium 05/03/2012 4.1  3.5 - 5.1 mEq/L Final  . Chloride 05/03/2012 100  96 - 112 mEq/L Final  . CO2 05/03/2012 32  19 - 32 mEq/L Final  . Glucose, Bld 05/03/2012 97  70 - 99 mg/dL Final  . BUN 29/56/2130 24* 6 - 23 mg/dL Final  . Creatinine, Ser 05/03/2012 1.04  0.50 - 1.10 mg/dL Final  . Calcium 86/57/8469 9.6  8.4 - 10.5 mg/dL Final  . Total Protein 05/03/2012 6.8  6.0 - 8.3 g/dL Final  . Albumin 62/95/2841 3.8  3.5 - 5.2 g/dL Final  . AST 32/44/0102 18  0 - 37 U/L Final  . ALT 05/03/2012 14  0 - 35 U/L Final  . Alkaline Phosphatase 05/03/2012 60  39 - 117 U/L Final  . Total Bilirubin 05/03/2012 0.4  0.3 - 1.2 mg/dL Final  . GFR calc non Af Amer 05/03/2012 56* >90 mL/min Final  . GFR calc Af Amer 05/03/2012 65* >90 mL/min Final   Comment:                                   The eGFR has been calculated                          using the CKD EPI equation.                          This calculation has not been                          validated in all clinical  situations.                          eGFR's persistently                          <90 mL/min signify                          possible Chronic Kidney Disease.  Marland Kitchen Prothrombin Time 05/03/2012 12.4  11.6 - 15.2 seconds Final  . INR 05/03/2012 0.93  0.00 - 1.49 Final  . ABO/RH(D) 05/03/2012 O POS   Final  . Antibody Screen 05/03/2012 NEG   Final  . Sample Expiration 05/03/2012 05/13/2012   Final  . Color, Urine 05/03/2012 YELLOW  YELLOW Final  . APPearance 05/03/2012 CLOUDY* CLEAR Final  . Specific Gravity, Urine 05/03/2012 1.019  1.005 - 1.030 Final  . pH 05/03/2012 6.0  5.0 - 8.0 Final  . Glucose, UA 05/03/2012 NEGATIVE  NEGATIVE mg/dL Final  . Hgb urine dipstick 05/03/2012 NEGATIVE  NEGATIVE Final  . Bilirubin Urine 05/03/2012 NEGATIVE  NEGATIVE Final  . Ketones, ur 05/03/2012 NEGATIVE  NEGATIVE mg/dL Final  . Protein, ur 69/62/9528 NEGATIVE  NEGATIVE mg/dL Final  . Urobilinogen, UA 05/03/2012 0.2  0.0 - 1.0 mg/dL Final  . Nitrite 41/32/4401 NEGATIVE  NEGATIVE Final  . Leukocytes, UA 05/03/2012 MODERATE* NEGATIVE Final  . Squamous Epithelial / LPF 05/03/2012 FEW* RARE Final  . WBC, UA 05/03/2012 3-6  <3 WBC/hpf Final  . Bacteria, UA 05/03/2012 FEW* RARE Final  . Specimen Description 05/03/2012 URINE, CLEAN CATCH   Final  . Special Requests 05/03/2012 NONE   Final  . Culture  Setup Time 05/03/2012 05/03/2012 14:31   Final  . Colony Count 05/03/2012 35,000 COLONIES/ML   Final  . Culture 05/03/2012 Multiple bacterial morphotypes present, none predominant. Suggest appropriate recollection if clinically indicated.   Final  . Report Status 05/03/2012 05/04/2012 FINAL   Final  . ABO/RH(D) 05/03/2012 O POS   Final     X-Rays:Dg Chest 2 View  05/03/2012   *RADIOLOGY REPORT*  Clinical Data: Preop knee replacement  CHEST - 2 VIEW  Comparison: None.  Findings: The lungs are clear.  Negative for pneumonia.  Negative for heart failure or effusion.  No mass lesion is identified. Thoracic and lumbar scoliosis.  IMPRESSION: No acute cardiopulmonary abnormality.   Original Report Authenticated By: Janeece Riggers, M.D.     EKG: Orders placed during the hospital encounter of 05/03/12  . EKG 12-LEAD  . EKG 12-LEAD     Hospital Course: Dorothy Lawson is a 62 y.o. who was admitted to [facility]. They were brought to the operating room on 05/10/2012 and underwent Procedure(s): TOTAL KNEE ARTHROPLASTY.  Patient tolerated the procedure well and was later transferred to the recovery room and then to the orthopaedic floor for postoperative care.  They were given PO and IV analgesics for pain control following their surgery.  They were given 24 hours of postoperative antibiotics of  Anti-infectives   Start     Dose/Rate Route Frequency Ordered Stop   05/10/12 1900  ceFAZolin (ANCEF) IVPB 1 g/50 mL premix     1 g 100 mL/hr over 30 Minutes Intravenous Every 6 hours 05/10/12 1628 05/11/12 0137     and started on DVT prophylaxis in the form of Xarelto.   PT and OT were ordered for total joint protocol.  Discharge planning consulted to help with postop disposition and equipment needs.  Patient had a decent night on the evening of surgery.  They started to get up OOB with therapy on day one. Hemovac drain was pulled without difficulty.  Continued to work with therapy into day two despite having some nausea and vomiting.  Dressing was changed on day two and the incision was healing well.  By day three, the patient had progressed with therapy and meeting their goals and feeling better.  Incision was healing well.  Patient was seen in rounds and was ready to go home.   Discharge Medications: Prior to Admission medications   Medication Sig Start Date End Date Taking?  Authorizing Provider  famotidine (PEPCID) 20 MG tablet Take 20 mg by mouth at bedtime.   Yes Historical Provider, MD  metoprolol (LOPRESSOR) 50 MG tablet Take 50 mg by mouth 2 (two) times daily.   Yes Historical Provider, MD  pravastatin (PRAVACHOL) 40 MG tablet Take 40 mg by mouth at bedtime.   Yes Historical Provider, MD  psyllium (HYDROCIL/METAMUCIL) 95 % PACK Take 1 packet by mouth at bedtime.   Yes Historical Provider, MD  triamterene-hydrochlorothiazide (MAXZIDE-25) 37.5-25 MG per tablet Take 1 tablet by mouth daily before breakfast.   Yes Historical Provider, MD  iron polysaccharides (NIFEREX) 150 MG capsule Take 1 capsule (150 mg total) by mouth daily. 05/13/12   Breeley Bischof, PA-C  methocarbamol (ROBAXIN) 500 MG tablet Take 1 tablet (500 mg total) by mouth every 6 (six) hours as needed. 05/13/12   Lovinia Snare, PA-C  oxyCODONE (OXY IR/ROXICODONE) 5 MG immediate release tablet Take 1-2 tablets (5-10 mg total) by mouth every 3 (three) hours as needed. 05/13/12   Jamey Demchak Julien Girt, PA-C  rivaroxaban (XARELTO) 10 MG TABS tablet Take 1 tablet (10 mg total) by mouth daily with breakfast. Take Xarelto for two and a half more weeks, then discontinue Xarelto. Once the patient has completed the Xarelto, they may resume the 81 mg Aspirin. 05/13/12   Gabriele Zwilling, PA-C  traMADol (ULTRAM) 50 MG tablet Take 1-2 tablets (50-100 mg total) by mouth every 6 (six) hours as needed. 05/13/12   Antonella Upson Julien Girt, PA-C    Diet: Cardiac diet Activity:WBAT Follow-up:in 2 weeks Disposition - Home Discharged Condition: good   Discharge Orders   Future Orders Complete By Expires     Call MD / Call 911  As directed     Comments:      If you experience chest pain or shortness of breath, CALL 911 and be transported to the hospital emergency room.  If you develope a fever above 101 F, pus (white drainage) or increased drainage or redness at the wound, or calf pain, call your surgeon's office.      Change dressing  As directed     Comments:      Change dressing daily with sterile 4 x 4 inch gauze dressing and apply TED hose. Do not submerge the incision under water.    Constipation Prevention  As directed     Comments:      Drink plenty of fluids.  Prune juice may be helpful.  You may use a stool softener, such as Colace (over the counter) 100 mg twice a day.  Use MiraLax (over the counter) for constipation as needed.    Diet - low sodium heart healthy  As directed     Discharge instructions  As directed     Comments:      Pick up  stool softner and laxative for home. Do not submerge incision under water. May shower. Continue to use ice for pain and swelling from surgery.  Take Xarelto for two and a half more weeks, then discontinue Xarelto.    Do not put a pillow under the knee. Place it under the heel.  As directed     Do not sit on low chairs, stoools or toilet seats, as it may be difficult to get up from low surfaces  As directed     Driving restrictions  As directed     Comments:      No driving until released by the physician.    Increase activity slowly as tolerated  As directed     Lifting restrictions  As directed     Comments:      No lifting until released by the physician.    Patient may shower  As directed     Comments:      You may shower without a dressing once there is no drainage.  Do not wash over the wound.  If drainage remains, do not shower until drainage stops.    TED hose  As directed     Comments:      Use stockings (TED hose) for 3 weeks on both leg(s).  You may remove them at night for sleeping.    Weight bearing as tolerated  As directed         Medication List    STOP taking these medications       aspirin EC 81 MG tablet     calcium-vitamin D 500-200 MG-UNIT per tablet  Commonly known as:  OSCAL WITH D     cholecalciferol 1000 UNITS tablet  Commonly known as:  VITAMIN D     magnesium gluconate 500 MG tablet  Commonly known as:   MAGONATE     multivitamin with minerals Tabs      TAKE these medications       famotidine 20 MG tablet  Commonly known as:  PEPCID  Take 20 mg by mouth at bedtime.     iron polysaccharides 150 MG capsule  Commonly known as:  NIFEREX  Take 1 capsule (150 mg total) by mouth daily.     methocarbamol 500 MG tablet  Commonly known as:  ROBAXIN  Take 1 tablet (500 mg total) by mouth every 6 (six) hours as needed.     metoprolol 50 MG tablet  Commonly known as:  LOPRESSOR  Take 50 mg by mouth 2 (two) times daily.     oxyCODONE 5 MG immediate release tablet  Commonly known as:  Oxy IR/ROXICODONE  Take 1-2 tablets (5-10 mg total) by mouth every 3 (three) hours as needed.     pravastatin 40 MG tablet  Commonly known as:  PRAVACHOL  Take 40 mg by mouth at bedtime.     psyllium 95 % Pack  Commonly known as:  HYDROCIL/METAMUCIL  Take 1 packet by mouth at bedtime.     rivaroxaban 10 MG Tabs tablet  Commonly known as:  XARELTO  Take 1 tablet (10 mg total) by mouth daily with breakfast. Take Xarelto for two and a half more weeks, then discontinue Xarelto.  Once the patient has completed the Xarelto, they may resume the 81 mg Aspirin.     traMADol 50 MG tablet  Commonly known as:  ULTRAM  Take 1-2 tablets (50-100 mg total) by mouth every 6 (six) hours as needed.     triamterene-hydrochlorothiazide  37.5-25 MG per tablet  Commonly known as:  MAXZIDE-25  Take 1 tablet by mouth daily before breakfast.           Follow-up Information   Follow up with Loanne Drilling, MD. Schedule an appointment as soon as possible for a visit in 2 weeks. (Call office for appointment and time.)    Contact information:   [address] [place] [address] [phone]       Signed: Patrica Duel 05/13/2012, 8:35 AM

## 2012-05-13 NOTE — Progress Notes (Signed)
   Subjective: 3 Days Post-Op Procedure(s) (LRB): TOTAL KNEE ARTHROPLASTY (Left) Patient reports pain as mild.   Patient seen in rounds with Dr. Lequita Halt.  Doing better today. Patient is well, and has had no acute complaints or problems Patient is ready to go home today.  Objective: Vital signs in last 24 hours: Temp:  [98.7 F (37.1 C)-99.8 F (37.7 C)] 99.8 F (37.7 C) (04/10 0543) Pulse Rate:  [73-88] 88 (04/10 0543) Resp:  [15-16] 16 (04/10 0543) BP: (107-120)/(59-70) 110/69 mmHg (04/10 0543) SpO2:  [94 %-99 %] 94 % (04/10 0543)  Intake/Output from previous day:  Intake/Output Summary (Last 24 hours) at 05/13/12 0705 Last data filed at 05/13/12 0600  Gross per 24 hour  Intake    240 ml  Output   2200 ml  Net  -1960 ml    Intake/Output this shift:    Labs:  Recent Labs  05/11/12 0500 05/12/12 0435 05/13/12 0438  HGB 9.7* 9.4* 8.7*    Recent Labs  05/12/12 0435 05/13/12 0438  WBC 14.2* 11.0*  RBC 2.99* 2.80*  HCT 26.5* 25.3*  PLT 210 217    Recent Labs  05/12/12 0435 05/13/12 0438  NA 131* 134*  K 3.7 3.9  CL 94* 96  CO2 31 34*  BUN 13 16  CREATININE 0.94 1.04  GLUCOSE 125* 109*  CALCIUM 9.2 9.0   No results found for this basename: LABPT, INR,  in the last 72 hours  EXAM: General - Patient is Alert, Appropriate and Oriented Extremity - Neurovascular intact Sensation intact distally Dorsiflexion/Plantar flexion intact No cellulitis present Incision - clean, dry, no drainage, healing Motor Function - intact, moving foot and toes well on exam.   Assessment/Plan: 3 Days Post-Op Procedure(s) (LRB): TOTAL KNEE ARTHROPLASTY (Left) Procedure(s) (LRB): TOTAL KNEE ARTHROPLASTY (Left) Past Medical History  Diagnosis Date  . Hypertension   . Hypercholesterolemia 04-15-12    tx. meds  . GERD (gastroesophageal reflux disease)     tx. Prilosec  . Arthritis 05-03-12    osteoarthritis - knees   Principal Problem:   OA (osteoarthritis) of  knee Active Problems:   Postoperative anemia due to acute blood loss   Hyponatremia  Estimated body mass index is 25.4 kg/(m^2) as calculated from the following:   Height as of this encounter: 5\' 7"  (1.702 m).   Weight as of this encounter: 73.576 kg (162 lb 3.3 oz). Up with therapy Discharge home with home health Diet - Cardiac diet Follow up - in 2 weeks Activity - WBAT Disposition - Home Condition Upon Discharge - Good D/C Meds - See DC Summary DVT Prophylaxis - Xarelto  PERKINS, ALEXZANDREW 05/13/2012, 7:05 AM

## 2012-05-13 NOTE — Progress Notes (Signed)
Physical Therapy Treatment Patient Details Name: Dorothy Lawson MRN: [id number] DOB: 1950/04/17 Today's Date: 05/13/2012 Time: 7829-5621 PT Time Calculation (min): 36 min  PT Assessment / Plan / Recommendation Comments on Treatment Session  POD # 3 L TKR am session.  Amb pt in hallway and performed 4 steps up backward with RW due to no rails.  Pt still demon decreased safety cognition and mental clearity.  Will return for a second PT session to perform stairs with spouse present.    Follow Up Recommendations  Home health PT     Does the patient have the potential to tolerate intense rehabilitation     Barriers to Discharge        Equipment Recommendations  None recommended by PT    Recommendations for Other Services    Frequency 7X/week   Plan Discharge plan remains appropriate    Precautions / Restrictions Precautions Precautions: Knee Precaution Comments: Instructed pt on KI use for amb and when to D/C Required Braces or Orthoses: Knee Immobilizer - Left Knee Immobilizer - Left: Discontinue once straight leg raise with < 10 degree lag Restrictions Weight Bearing Restrictions: No RLE Weight Bearing: Weight bearing as tolerated   Pertinent Vitals/Pain C/o 5/10 pain ICE applied   Mobility  Bed Mobility Bed Mobility: Supine to Sit;Sit to Supine Supine to Sit: 4: Min assist;HOB flat Sit to Supine: 4: Min assist Details for Bed Mobility Assistance: Assist for LLE into and out of bed with cues for hand placement and technique. Transfers Transfers: Sit to Stand;Stand to Sit Sit to Stand: 4: Min guard;From bed;From chair/3-in-1 Stand to Sit: 4: Min guard;To chair/3-in-1;To bed Details for Transfer Assistance: Pt required increased time and 50% VC's on proper tech Ambulation/Gait Ambulation/Gait Assistance: 4: Min guard Ambulation Distance (Feet): 145 Feet Assistive device: Rolling walker Ambulation/Gait Assistance Details: 50% VC's to increase step length and advance RW  to increase BOS/safety.   Gait Pattern: Step-to pattern;Decreased stride length;Antalgic;Trunk flexed Gait velocity: very slow gait speed.  Stairs: Yes Stairs Assistance: 4: Min assist Stairs Assistance Details (indicate cue type and reason): 75% Vc's on proper tech and increased time. Will need to perform again when spouse is present as pt demon decreased safety cognition and comprehension. Stair Management Technique: No rails;Backwards;With walker     PT Goals                                          progressing    Visit Information  Last PT Received On: 05/13/12 Assistance Needed: +1    Subjective Data      Cognition    impaired (meds?)   Balance   fair -  End of Session PT - End of Session Equipment Utilized During Treatment: Left knee immobilizer Activity Tolerance: Patient tolerated treatment well Patient left: in bed;with call bell/phone within reach CPM Left Knee CPM Left Knee: Off   Felecia Shelling  PTA [facility]  Acute  Rehab Pager      [phone]

## 2012-05-13 NOTE — Progress Notes (Signed)
Physical Therapy Treatment Patient Details Name: Dorothy Lawson MRN: [id number] DOB: 02-01-1951 Today's Date: 05/13/2012 Time: 4098-1191 PT Time Calculation (min): 32 min  PT Assessment / Plan / Recommendation Comments on Treatment Session  POD # 3 L TKR pm session. With spouse present for family education, performed stairs again.  Instructed on use of ICE.  Instructed on HEP.  Instructed on KI use for amb/stairs and when to D/C.    Follow Up Recommendations  Home health PT     Does the patient have the potential to tolerate intense rehabilitation     Barriers to Discharge        Equipment Recommendations  None recommended by PT    Recommendations for Other Services    Frequency 7X/week   Plan Discharge plan remains appropriate    Precautions / Restrictions Precautions Precautions: Knee Precaution Comments: Instructed pt on KI use for amb and when to D/C Required Braces or Orthoses: Knee Immobilizer - Left Knee Immobilizer - Left: Discontinue once straight leg raise with < 10 degree lag Restrictions Weight Bearing Restrictions: No RLE Weight Bearing: Weight bearing as tolerated   Pertinent Vitals/Pain C/o 3/10 knee pain    Mobility  Transfers Transfers: Sit to Stand;Stand to Sit Sit to Stand: 4: Min guard;From bed Stand to Sit: 4: Min guard;To wc Details for Transfer Assistance: Pt required increased time and 50% VC's on proper tech  Stairs: Yes Stairs Assistance: 4: Min assist Stairs Assistance Details (indicate cue type and reason): with spouse present performed backward approach up 4 steps using RW.  Family education and handout Stair Management Technique: No rails;Backwards;With walker     PT Goals                                            progressing    Visit Information  Last PT Received On: 05/13/12 Assistance Needed: +1                   End of Session PT - End of Session Equipment Utilized During Treatment: Left knee immobilizer Activity  Tolerance: Patient tolerated treatment well Patient left: in wc;with call bell/phone within reach/spouse   Felecia Shelling  PTA [facility]  Acute  Rehab Pager      [phone]

## 2012-12-10 ENCOUNTER — Encounter (INDEPENDENT_AMBULATORY_CARE_PROVIDER_SITE_OTHER): Payer: Self-pay

## 2013-12-08 ENCOUNTER — Telehealth (INDEPENDENT_AMBULATORY_CARE_PROVIDER_SITE_OTHER): Payer: Self-pay | Admitting: *Deleted

## 2013-12-08 ENCOUNTER — Other Ambulatory Visit (INDEPENDENT_AMBULATORY_CARE_PROVIDER_SITE_OTHER): Payer: Self-pay | Admitting: *Deleted

## 2013-12-08 DIAGNOSIS — Z1211 Encounter for screening for malignant neoplasm of colon: Secondary | ICD-10-CM

## 2013-12-08 DIAGNOSIS — Z86010 Personal history of colonic polyps: Secondary | ICD-10-CM

## 2013-12-08 NOTE — Telephone Encounter (Signed)
Patient needs movi prep 

## 2013-12-12 MED ORDER — PEG-KCL-NACL-NASULF-NA ASC-C 100 G PO SOLR: 1.0000 | Freq: Once | ORAL | Status: DC

## 2014-01-04 ENCOUNTER — Encounter (INDEPENDENT_AMBULATORY_CARE_PROVIDER_SITE_OTHER): Payer: Self-pay | Admitting: *Deleted

## 2014-02-07 ENCOUNTER — Encounter (INDEPENDENT_AMBULATORY_CARE_PROVIDER_SITE_OTHER): Payer: Self-pay | Admitting: *Deleted

## 2014-02-07 NOTE — Telephone Encounter (Signed)
This encounter was created in error - please disregard.

## 2014-02-14 ENCOUNTER — Telehealth (INDEPENDENT_AMBULATORY_CARE_PROVIDER_SITE_OTHER): Payer: Self-pay | Admitting: *Deleted

## 2014-02-14 NOTE — Telephone Encounter (Signed)
Referring MD/PCP: tapper   Procedure: tcs  Reason/Indication:  Hx polyps  Has patient had this procedure before?  Yes, 2008 -- scanned  If so, when, by whom and where?    Is there a family history of colon cancer?  no  Who?  What age when diagnosed?    Is patient diabetic?   no      Does patient have prosthetic heart valve?  no  Do you have a pacemaker?  no  Has patient ever had endocarditis? no  Has patient had joint replacement within last 12 months?  no  Does patient tend to be constipated or take laxatives? no  Is patient on Coumadin, Plavix and/or Aspirin? yes  Medications: asa 81 mg daily, pravastatin 40 mg daily, famotidine 20 mg daily, metoprolol 50 mg bid, triamterene/hctz 37.5/25 mg 1/2 tab daily  Allergies: nkda  Medication Adjustment: asa 2 days  Procedure date & time: 03/16/14 at 930

## 2014-02-17 NOTE — Telephone Encounter (Signed)
agree

## 2014-03-16 ENCOUNTER — Encounter (HOSPITAL_COMMUNITY): Payer: Self-pay | Admitting: *Deleted

## 2014-03-16 ENCOUNTER — Encounter (HOSPITAL_COMMUNITY)
Admission: RE | Disposition: A | Discharge status: Home / Self Care | Payer: Self-pay | Source: Ambulatory Visit | Attending: Internal Medicine

## 2014-03-16 ENCOUNTER — Ambulatory Visit (HOSPITAL_COMMUNITY)
Admission: RE | Admit: 2014-03-16 | Discharge: 2014-03-16 | Disposition: A | Discharge status: Home / Self Care | Payer: BC Managed Care – PPO | Source: Ambulatory Visit | Attending: Internal Medicine | Admitting: Internal Medicine

## 2014-03-16 DIAGNOSIS — Z96652 Presence of left artificial knee joint: Secondary | ICD-10-CM | POA: Insufficient documentation

## 2014-03-16 DIAGNOSIS — K219 Gastro-esophageal reflux disease without esophagitis: Secondary | ICD-10-CM | POA: Insufficient documentation

## 2014-03-16 DIAGNOSIS — Z79891 Long term (current) use of opiate analgesic: Secondary | ICD-10-CM | POA: Diagnosis not present

## 2014-03-16 DIAGNOSIS — M170 Bilateral primary osteoarthritis of knee: Secondary | ICD-10-CM | POA: Diagnosis not present

## 2014-03-16 DIAGNOSIS — Z86010 Personal history of colonic polyps: Secondary | ICD-10-CM | POA: Insufficient documentation

## 2014-03-16 DIAGNOSIS — K644 Residual hemorrhoidal skin tags: Secondary | ICD-10-CM | POA: Diagnosis not present

## 2014-03-16 DIAGNOSIS — Z7982 Long term (current) use of aspirin: Secondary | ICD-10-CM | POA: Diagnosis not present

## 2014-03-16 DIAGNOSIS — K648 Other hemorrhoids: Secondary | ICD-10-CM

## 2014-03-16 DIAGNOSIS — E780 Pure hypercholesterolemia: Secondary | ICD-10-CM | POA: Insufficient documentation

## 2014-03-16 DIAGNOSIS — K5730 Diverticulosis of large intestine without perforation or abscess without bleeding: Secondary | ICD-10-CM | POA: Diagnosis not present

## 2014-03-16 DIAGNOSIS — I10 Essential (primary) hypertension: Secondary | ICD-10-CM | POA: Insufficient documentation

## 2014-03-16 DIAGNOSIS — Z09 Encounter for follow-up examination after completed treatment for conditions other than malignant neoplasm: Secondary | ICD-10-CM | POA: Diagnosis present

## 2014-03-16 DIAGNOSIS — Z79899 Other long term (current) drug therapy: Secondary | ICD-10-CM | POA: Diagnosis not present

## 2014-03-16 HISTORY — PX: COLONOSCOPY: SHX5424

## 2014-03-16 SURGERY — COLONOSCOPY
Anesthesia: Moderate Sedation

## 2014-03-16 MED ORDER — SODIUM CHLORIDE 0.9 % IV SOLN
INTRAVENOUS | Status: DC
  Administered 2014-03-16: 09:00:00 via INTRAVENOUS

## 2014-03-16 MED ORDER — MEPERIDINE HCL 50 MG/ML IJ SOLN
INTRAMUSCULAR | Status: AC
  Filled 2014-03-16: qty 1

## 2014-03-16 MED ORDER — MIDAZOLAM HCL 5 MG/5ML IJ SOLN
INTRAMUSCULAR | Status: DC | PRN
  Administered 2014-03-16: 1 mg via INTRAVENOUS
  Administered 2014-03-16 (×2): 2 mg via INTRAVENOUS

## 2014-03-16 MED ORDER — MEPERIDINE HCL 50 MG/ML IJ SOLN
INTRAMUSCULAR | Status: DC | PRN
  Administered 2014-03-16 (×2): 25 mg via INTRAVENOUS

## 2014-03-16 MED ORDER — MIDAZOLAM HCL 5 MG/5ML IJ SOLN
INTRAMUSCULAR | Status: AC
  Filled 2014-03-16: qty 10

## 2014-03-16 MED ORDER — STERILE WATER FOR IRRIGATION IR SOLN
Status: DC | PRN
  Administered 2014-03-16: 10:00:00

## 2014-03-16 NOTE — H&P (Signed)
Dorothy Lawson is an 64 y.o. female.   Chief Complaint: Patient is here for colonoscopy. HPI: Patient is 64 year old Caucasian female who is here for surveillance colonoscopy. She had single small polyp removed in 2008 and was tubular adenoma. She was advised to return for repeat exam in 7 years. She denies abdominal pain change in her bowel habits or rectal bleeding. Family history is negative for CRC. On her last exam she told me her sister had polyps but she doesn't remember she will check with her sister to update family history.  Past Medical History  Diagnosis Date  . Hypertension   . Hypercholesterolemia 04-15-12    tx. meds  . GERD (gastroesophageal reflux disease)     tx. Prilosec  . Arthritis 05-03-12    osteoarthritis - knees    Past Surgical History  Procedure Laterality Date  . Tubal ligation    . Colonoscopy w/ polypectomy    . Total knee arthroplasty Left 05/10/2012    Procedure: TOTAL KNEE ARTHROPLASTY;  Surgeon: [name], MD;  Location: [facility]S;  Service: Orthopedics;  Laterality: Left;    No family history on file. Social History:  reports that she has never smoked. She has never used smokeless tobacco. She reports that she drinks alcohol. She reports that she does not use illicit drugs.  Allergies: No Known Allergies  Medications Prior to Admission  Medication Sig Dispense Refill  . aspirin EC 81 MG tablet Take 81 mg by mouth daily.    Marland Kitchen CALCIUM PO Take 1 tablet by mouth 2 (two) times daily.    . famotidine (PEPCID) 20 MG tablet Take 20 mg by mouth at bedtime.    . Glucosamine-Chondroitin (GLUCOSAMINE CHONDR COMPLEX PO) Take 1 tablet by mouth daily.    . Magnesium Hydroxide (MAGNESIA PO) Take by mouth.    . metoprolol (LOPRESSOR) 50 MG tablet Take 50 mg by mouth 2 (two) times daily.    . Multiple Vitamin (MULTIVITAMIN WITH MINERALS) TABS tablet Take 1 tablet by mouth daily.    . peg 3350 powder (MOVIPREP) 100 G SOLR Take 1 kit (200 g total) by mouth once.  1 kit 0  . pravastatin (PRAVACHOL) 40 MG tablet Take 40 mg by mouth at bedtime.    . triamterene-hydrochlorothiazide (MAXZIDE-25) 37.5-25 MG per tablet Take 0.5 tablets by mouth daily before breakfast.     . iron polysaccharides (NIFEREX) 150 MG capsule Take 1 capsule (150 mg total) by mouth daily. 21 capsule 0  . methocarbamol (ROBAXIN) 500 MG tablet Take 1 tablet (500 mg total) by mouth every 6 (six) hours as needed. (Patient not taking: Reported on 03/03/2014) 80 tablet 0  . oxyCODONE (OXY IR/ROXICODONE) 5 MG immediate release tablet Take 1-2 tablets (5-10 mg total) by mouth every 3 (three) hours as needed. (Patient not taking: Reported on 03/03/2014) 80 tablet 0  . rivaroxaban (XARELTO) 10 MG TABS tablet Take 1 tablet (10 mg total) by mouth daily with breakfast. Take Xarelto for two and a half more weeks, then discontinue Xarelto. Once the patient has completed the Xarelto, they may resume the 81 mg Aspirin. (Patient not taking: Reported on 03/03/2014) 18 tablet 0  . traMADol (ULTRAM) 50 MG tablet Take 1-2 tablets (50-100 mg total) by mouth every 6 (six) hours as needed. (Patient not taking: Reported on 03/03/2014) 60 tablet 0    No results found for this or any previous visit (from the past 48 hour(s)). No results found.  ROS  Blood pressure 121/70,  pulse 72, temperature 97.9 F (36.6 C), temperature source Oral, resp. rate 12, height _0  (1.676 m), weight 165 lb (74.844 kg), SpO2 100 %. Physical Exam  Constitutional: She appears well-developed and well-nourished.  HENT:  Mouth/Throat: Oropharynx is clear and moist.  Eyes: No scleral icterus.  Neck: No thyromegaly present.  Cardiovascular: Normal rate, regular rhythm and normal heart sounds.   No murmur heard. Respiratory: Effort normal and breath sounds normal.  GI: Soft. She exhibits no distension and no mass. There is no tenderness.  Musculoskeletal: She exhibits no edema.  Lymphadenopathy:    She has no cervical adenopathy.   Neurological: She is alert.  Skin: Skin is warm and dry.     Assessment/Plan History of colonic adenoma. Surveillance colonoscopy.    REHMAN,NAJEEB U 03/16/2014, 9:53 AM

## 2014-03-16 NOTE — Op Note (Addendum)
COLONOSCOPY PROCEDURE REPORT  PATIENT:  Dorothy Lawson  MR#:  [id number] Birthdate:  03-Apr-1950, 64 y.o., female Endoscopist:  Dr. Rogene Houston, MD Referred By:  Dr. Matthias Hughs, MD  Procedure Date: 03/16/2014  Procedure:   Colonoscopy  Indications:  Patient is 64 year old Caucasian female was history of colonic adenoma and is here for surveillance colonoscopy. Last exam was in 2008.  Informed Consent:  The procedure and risks were reviewed with the patient and informed consent was obtained.  Medications:  Demerol 50 mg IV Versed 5 mg IV  Description of procedure:  After a digital rectal exam was performed, that colonoscope was advanced from the anus through the rectum and colon to the area of the cecum, ileocecal valve and appendiceal orifice. The cecum was deeply intubated. These structures were well-seen and photographed for the record. From the level of the cecum and ileocecal valve, the scope was slowly and cautiously withdrawn. The mucosal surfaces were carefully surveyed utilizing scope tip to flexion to facilitate fold flattening as needed. The scope was pulled down into the rectum where a thorough exam including retroflexion was performed. Terminal ileum was also examined.  Findings:   Prep excellent. Normal mucosa of terminal ileum. Few small diverticula at sigmoid colon. Normal rectal mucosa. Focal scar above dentate line and hemorrhoids below the dentate line.   Therapeutic/Diagnostic Maneuvers Performed:  None  Complications:  None  Cecal Withdrawal Time:  10 minutes  Impression:  Normal mucosa of terminal ileum. Few small diverticula at sigmoid colon. External hemorrhoids. No evidence of recurrent polyps.  Recommendations:  Standard instructions given. Next colonoscopy in 10 years unless family history changes.  Dorothy Lawson U  03/16/2014 10:28 AM  CC: Dr. Deloria Lair, MD & Dr. Rayne Du ref. provider found  Addendum; Patient's sister had multiple  polyps removed in 2008 and one in 2011. Therefore recommend patient return for next colonoscopy in 5 years.

## 2014-03-16 NOTE — Discharge Instructions (Signed)
Resume usual medications and high fiber diet. No driving for 24 hours. Next colonoscopy in 10 years unless family history changes.   High-Fiber Diet Fiber is found in fruits, vegetables, and grains. A high-fiber diet encourages the addition of more whole grains, legumes, fruits, and vegetables in your diet. The recommended amount of fiber for adult males is 38 g per day. For adult females, it is 25 g per day. Pregnant and lactating women should get 28 g of fiber per day. If you have a digestive or bowel problem, ask your caregiver for advice before adding high-fiber foods to your diet. Eat a variety of high-fiber foods instead of only a select few type of foods.  PURPOSE  To increase stool bulk.  To make bowel movements more regular to prevent constipation.  To lower cholesterol.  To prevent overeating. WHEN IS THIS DIET USED?  It may be used if you have constipation and hemorrhoids.  It may be used if you have uncomplicated diverticulosis (intestine condition) and irritable bowel syndrome.  It may be used if you need help with weight management.  It may be used if you want to add it to your diet as a protective measure against atherosclerosis, diabetes, and cancer. SOURCES OF FIBER  Whole-grain breads and cereals.  Fruits, such as apples, oranges, bananas, berries, prunes, and pears.  Vegetables, such as green peas, carrots, sweet potatoes, beets, broccoli, cabbage, spinach, and artichokes.  Legumes, such split peas, soy, lentils.  Almonds. FIBER CONTENT IN FOODS Starches and Grains / Dietary Fiber (g)  Cheerios, 1 cup / 3 g  Corn Flakes cereal, 1 cup / 0.7 g  Rice crispy treat cereal, 1 cup / 0.3 g  Instant oatmeal (cooked),  cup / 2 g  Frosted wheat cereal, 1 cup / 5.1 g  Brown, long-grain rice (cooked), 1 cup / 3.5 g  White, long-grain rice (cooked), 1 cup / 0.6 g  Enriched macaroni (cooked), 1 cup / 2.5 g Legumes / Dietary Fiber (g)  Baked beans (canned,  plain, or vegetarian),  cup / 5.2 g  Kidney beans (canned),  cup / 6.8 g  Pinto beans (cooked),  cup / 5.5 g Breads and Crackers / Dietary Fiber (g)  Plain or honey graham crackers, 2 squares / 0.7 g  Saltine crackers, 3 squares / 0.3 g  Plain, salted pretzels, 10 pieces / 1.8 g  Whole-wheat bread, 1 slice / 1.9 g  White bread, 1 slice / 0.7 g  Raisin bread, 1 slice / 1.2 g  Plain bagel, 3 oz / 2 g  Flour tortilla, 1 oz / 0.9 g  Corn tortilla, 1 small / 1.5 g  Hamburger or hotdog bun, 1 small / 0.9 g Fruits / Dietary Fiber (g)  Apple with skin, 1 medium / 4.4 g  Sweetened applesauce,  cup / 1.5 g  Banana,  medium / 1.5 g  Grapes, 10 grapes / 0.4 g  Orange, 1 small / 2.3 g  Raisin, 1.5 oz / 1.6 g  Melon, 1 cup / 1.4 g Vegetables / Dietary Fiber (g)  Green beans (canned),  cup / 1.3 g  Carrots (cooked),  cup / 2.3 g  Broccoli (cooked),  cup / 2.8 g  Peas (cooked),  cup / 4.4 g  Mashed potatoes,  cup / 1.6 g  Lettuce, 1 cup / 0.5 g  Corn (canned),  cup / 1.6 g  Tomato,  cup / 1.1 g Document Released: 01/20/2005 Document Revised: 07/22/2011 Document Reviewed:  04/24/2011 Exit[employer] Patient Information 2015 [place], [place]. This information is not intended to replace advice given to you by your health care provider. Make sure you discuss any questions you have with your health care provider. Colonoscopy, Care After These instructions give you information on caring for yourself after your procedure. Your doctor may also give you more specific instructions. Call your doctor if you have any problems or questions after your procedure. HOME CARE  Do not drive for 24 hours.  Do not sign important papers or use machinery for 24 hours.  You may shower.  You may go back to your usual activities, but go slower for the first 24 hours.  Take rest breaks often during the first 24 hours.  Walk around or use warm packs on your belly (abdomen) if you have  belly cramping or gas.  Drink enough fluids to keep your pee (urine) clear or pale yellow.  Resume your normal diet. Avoid heavy or fried foods.  Avoid drinking alcohol for 24 hours or as told by your doctor.  Only take medicines as told by your doctor. If a tissue sample (biopsy) was taken during the procedure:   Do not take aspirin or blood thinners for 7 days, or as told by your doctor.  Do not drink alcohol for 7 days, or as told by your doctor.  Eat soft foods for the first 24 hours. GET HELP IF: You still have a small amount of blood in your poop (stool) 2-3 days after the procedure. GET HELP RIGHT AWAY IF:  You have more than a small amount of blood in your poop.  You see clumps of tissue (blood clots) in your poop.  Your belly is puffy (swollen).  You feel sick to your stomach (nauseous) or throw up (vomit).  You have a fever.  You have belly pain that gets worse and medicine does not help. MAKE SURE YOU:  Understand these instructions.  Will watch your condition.  Will get help right away if you are not doing well or get worse. Document Released: 02/22/2010 Document Revised: 01/25/2013 Document Reviewed: 09/27/2012 [facility] Patient Information 2015 [place], [place]. This information is not intended to replace advice given to you by your health care provider. Make sure you discuss any questions you have with your health care provider.

## 2014-03-17 ENCOUNTER — Encounter (HOSPITAL_COMMUNITY): Payer: Self-pay | Admitting: Internal Medicine

## 2014-03-21 ENCOUNTER — Telehealth (INDEPENDENT_AMBULATORY_CARE_PROVIDER_SITE_OTHER): Payer: Self-pay | Admitting: *Deleted

## 2014-03-21 NOTE — Telephone Encounter (Signed)
This message was shared with Dr.Rehman.

## 2014-03-21 NOTE — Telephone Encounter (Signed)
Dorothy Lawson had a TCS last Thursday and feels like she needs to go to the bathroom more. She is also having pressure to urinate. Wasn't sure if this could have came from the procedure or not.  FYI for Dr. Laural Golden: Her sister's TCS in 2008 and 2011 had non-cancer polyps removed. Her return phone number is [phone].

## 2014-03-21 NOTE — Telephone Encounter (Signed)
Forward to Dr.Rehman 

## 2014-03-22 NOTE — Telephone Encounter (Signed)
Call returned; Patient states her sister had multiple polyps removed in 2008 and one polyp removed in 2011. Therefore will recommend patient undergo next colonoscopy in 5 years rather than 10. Patient noted minimal abdominal soreness but it almost resolved. Will add addendum to procedure note.

## 2014-03-23 NOTE — Telephone Encounter (Signed)
TCS noted for 5 yrs in recall

## 2014-04-11 ENCOUNTER — Telehealth (INDEPENDENT_AMBULATORY_CARE_PROVIDER_SITE_OTHER): Payer: Self-pay | Admitting: *Deleted

## 2014-04-11 NOTE — Telephone Encounter (Signed)
Patient has left a voice mail concerned on her insurance not paying on her colonoscopy since it was surveillance.  They are telling her that since surveillance she has no benefits basically and if screening they would pay.    I spoke to GI coder Alisia Ferrari and she agrees the coding and filing is correct.   I have called patient and left message for call back to discuss with her

## 2014-04-12 NOTE — Telephone Encounter (Signed)
Did speak with patient.  Understood and was so disappointed that her insurance had this type coverage on her policy.  I told her I would be glad to call them myself and see what her policy stated also.  She wants me to and call her back.  Told her I would get back with her by Thursday this week with answer.

## 2016-05-01 ENCOUNTER — Ambulatory Visit: Payer: Self-pay | Admitting: Orthopedic Surgery

## 2016-05-08 NOTE — Patient Instructions (Addendum)
Dorothy Lawson  05/08/2016   Your procedure is scheduled on: 05-19-16  Report to [facility] Main  Entrance to Admitting at [phone].  Call this number if you have problems the morning of surgery [phone]   Remember: ONLY 1 PERSON MAY GO WITH YOU TO SHORT STAY TO GET  READY MORNING OF YOUR SURGERY.  Do not eat food or drink liquids :After Midnight.     Take these medicines the morning of surgery with A SIP OF WATER: Metoprolol (Lopressor)                               You may not have any metal on your body including hair pins and              piercings  Do not wear jewelry, make-up, lotions, powders or perfumes, deodorant             Do not wear nail polish.  Do not shave  48 hours prior to surgery.                Do not bring valuables to the hospital. [place].  Contacts, dentures or bridgework may not be worn into surgery.  Leave suitcase in the car. After surgery it may be brought to your room.     Patients discharged the day of surgery will not be allowed to drive home.  Name and phone number of your driver:  Special Instructions: N/A              Please read over the following fact sheets you were given: _____________________________________________________________________             [facility] - Preparing for Surgery Before surgery, you can play an important role.  Because skin is not sterile, your skin needs to be as free of germs as possible.  You can reduce the number of germs on your skin by washing with CHG (chlorahexidine gluconate) soap before surgery.  CHG is an antiseptic cleaner which kills germs and bonds with the skin to continue killing germs even after washing. Please DO NOT use if you have an allergy to CHG or antibacterial soaps.  If your skin becomes reddened/irritated stop using the CHG and inform your nurse when you arrive at Short Stay. Do not shave (including legs and  underarms) for at least 48 hours prior to the first CHG shower.  You may shave your face/neck. Please follow these instructions carefully:  1.  Shower with CHG Soap the night before surgery and the  morning of Surgery.  2.  If you choose to wash your hair, wash your hair first as usual with your  normal  shampoo.  3.  After you shampoo, rinse your hair and body thoroughly to remove the  shampoo.                           4.  Use CHG as you would any other liquid soap.  You can apply chg directly  to the skin and wash                       Gently with a scrungie or clean  washcloth.  5.  Apply the CHG Soap to your body ONLY FROM THE NECK DOWN.   Do not use on face/ open                           Wound or open sores. Avoid contact with eyes, ears mouth and genitals (private parts).                       Wash face,  Genitals (private parts) with your normal soap.             6.  Wash thoroughly, paying special attention to the area where your surgery  will be performed.  7.  Thoroughly rinse your body with warm water from the neck down.  8.  DO NOT shower/wash with your normal soap after using and rinsing off  the CHG Soap.                9.  Pat yourself dry with a clean towel.            10.  Wear clean pajamas.            11.  Place clean sheets on your bed the night of your first shower and do not  sleep with pets. Day of Surgery : Do not apply any lotions/deodorants the morning of surgery.  Please wear clean clothes to the hospital/surgery center.  FAILURE TO FOLLOW THESE INSTRUCTIONS MAY RESULT IN THE CANCELLATION OF YOUR SURGERY PATIENT SIGNATURE_________________________________  NURSE SIGNATURE__________________________________  ________________________________________________________________________   Adam Phenix  An incentive spirometer is a tool that can help keep your lungs clear and active. This tool measures how well you are filling your lungs with each breath. Taking  long deep breaths may help reverse or decrease the chance of developing breathing (pulmonary) problems (especially infection) following:  A long period of time when you are unable to move or be active. BEFORE THE PROCEDURE   If the spirometer includes an indicator to show your best effort, your nurse or respiratory therapist will set it to a desired goal.  If possible, sit up straight or lean slightly forward. Try not to slouch.  Hold the incentive spirometer in an upright position. INSTRUCTIONS FOR USE  1. Sit on the edge of your bed if possible, or sit up as far as you can in bed or on a chair. 2. Hold the incentive spirometer in an upright position. 3. Breathe out normally. 4. Place the mouthpiece in your mouth and seal your lips tightly around it. 5. Breathe in slowly and as deeply as possible, raising the piston or the ball toward the top of the column. 6. Hold your breath for 3-5 seconds or for as long as possible. Allow the piston or ball to fall to the bottom of the column. 7. Remove the mouthpiece from your mouth and breathe out normally. 8. Rest for a few seconds and repeat Steps 1 through 7 at least 10 times every 1-2 hours when you are awake. Take your time and take a few normal breaths between deep breaths. 9. The spirometer may include an indicator to show your best effort. Use the indicator as a goal to work toward during each repetition. 10. After each set of 10 deep breaths, practice coughing to be sure your lungs are clear. If you have an incision (the cut made at the time of surgery), support your incision when coughing by  placing a pillow or rolled up towels firmly against it. Once you are able to get out of bed, walk around indoors and cough well. You may stop using the incentive spirometer when instructed by your caregiver.  RISKS AND COMPLICATIONS  Take your time so you do not get dizzy or light-headed.  If you are in pain, you may need to take or ask for pain  medication before doing incentive spirometry. It is harder to take a deep breath if you are having pain. AFTER USE  Rest and breathe slowly and easily.  It can be helpful to keep track of a log of your progress. Your caregiver can provide you with a simple table to help with this. If you are using the spirometer at home, follow these instructions: [place] IF:   You are having difficultly using the spirometer.  You have trouble using the spirometer as often as instructed.  Your pain medication is not giving enough relief while using the spirometer.  You develop fever of 100.5 F (38.1 C) or higher. SEEK IMMEDIATE MEDICAL CARE IF:   You cough up bloody sputum that had not been present before.  You develop fever of 102 F (38.9 C) or greater.  You develop worsening pain at or near the incision site. MAKE SURE YOU:   Understand these instructions.  Will watch your condition.  Will get help right away if you are not doing well or get worse. Document Released: 06/02/2006 Document Revised: 04/14/2011 Document Reviewed: 08/03/2006 ExitCare Patient Information 2014 Exit[employer], [place].   ________________________________________________________________________  WHAT IS A BLOOD TRANSFUSION? Blood Transfusion Information  A transfusion is the replacement of blood or some of its parts. Blood is made up of multiple cells which provide different functions.  Red blood cells carry oxygen and are used for blood loss replacement.  White blood cells fight against infection.  Platelets control bleeding.  Plasma helps clot blood.  Other blood products are available for specialized needs, such as hemophilia or other clotting disorders. BEFORE THE TRANSFUSION  Who gives blood for transfusions?   Healthy volunteers who are fully evaluated to make sure their blood is safe. This is blood bank blood. Transfusion therapy is the safest it has ever been in the practice of medicine.  Before blood is taken from a donor, a complete history is taken to make sure that person has no history of diseases nor engages in risky social behavior (examples are intravenous drug use or sexual activity with multiple partners). The donor's travel history is screened to minimize risk of transmitting infections, such as malaria. The donated blood is tested for signs of infectious diseases, such as HIV and hepatitis. The blood is then tested to be sure it is compatible with you in order to minimize the chance of a transfusion reaction. If you or a relative donates blood, this is often done in anticipation of surgery and is not appropriate for emergency situations. It takes many days to process the donated blood. RISKS AND COMPLICATIONS Although transfusion therapy is very safe and saves many lives, the main dangers of transfusion include:   Getting an infectious disease.  Developing a transfusion reaction. This is an allergic reaction to something in the blood you were given. Every precaution is taken to prevent this. The decision to have a blood transfusion has been considered carefully by your caregiver before blood is given. Blood is not given unless the benefits outweigh the risks. AFTER THE TRANSFUSION  Right after receiving a blood  transfusion, you will usually feel much better and more energetic. This is especially true if your red blood cells have gotten low (anemic). The transfusion raises the level of the red blood cells which carry oxygen, and this usually causes an energy increase.  The nurse administering the transfusion will monitor you carefully for complications. HOME CARE INSTRUCTIONS  No special instructions are needed after a transfusion. You may find your energy is better. Speak with your caregiver about any limitations on activity for underlying diseases you may have. SEEK MEDICAL CARE IF:   Your condition is not improving after your transfusion.  You develop redness or  irritation at the intravenous (IV) site. SEEK IMMEDIATE MEDICAL CARE IF:  Any of the following symptoms occur over the next 12 hours:  Shaking chills.  You have a temperature by mouth above 102 F (38.9 C), not controlled by medicine.  Chest, back, or muscle pain.  People around you feel you are not acting correctly or are confused.  Shortness of breath or difficulty breathing.  Dizziness and fainting.  You get a rash or develop hives.  You have a decrease in urine output.  Your urine turns a dark color or changes to pink, red, or brown. Any of the following symptoms occur over the next 10 days:  You have a temperature by mouth above 102 F (38.9 C), not controlled by medicine.  Shortness of breath.  Weakness after normal activity.  The white part of the eye turns yellow (jaundice).  You have a decrease in the amount of urine or are urinating less often.  Your urine turns a dark color or changes to pink, red, or brown. Document Released: 01/18/2000 Document Revised: 04/14/2011 Document Reviewed: 09/06/2007 [facility] Patient Information 2014 [place], [place].  _______________________________________________________________________

## 2016-05-09 ENCOUNTER — Other Ambulatory Visit (HOSPITAL_COMMUNITY): Payer: Self-pay | Admitting: *Deleted

## 2016-05-09 NOTE — Progress Notes (Signed)
medical clearance note dr Shanon Brow tapper on chart

## 2016-05-12 ENCOUNTER — Encounter (HOSPITAL_COMMUNITY): Payer: Self-pay

## 2016-05-12 ENCOUNTER — Encounter (HOSPITAL_COMMUNITY)
Admission: RE | Admit: 2016-05-12 | Discharge: 2016-05-12 | Disposition: A | Discharge status: Home / Self Care | Payer: Medicare Other | Source: Ambulatory Visit | Attending: Orthopedic Surgery | Admitting: Orthopedic Surgery

## 2016-05-12 DIAGNOSIS — Z01810 Encounter for preprocedural cardiovascular examination: Secondary | ICD-10-CM | POA: Insufficient documentation

## 2016-05-12 DIAGNOSIS — I10 Essential (primary) hypertension: Secondary | ICD-10-CM | POA: Diagnosis not present

## 2016-05-12 DIAGNOSIS — M1711 Unilateral primary osteoarthritis, right knee: Secondary | ICD-10-CM | POA: Diagnosis not present

## 2016-05-12 LAB — COMPREHENSIVE METABOLIC PANEL
ALT: 20 U/L (ref 14–54)
AST: 24 U/L (ref 15–41)
Albumin: 4.2 g/dL (ref 3.5–5.0)
Alkaline Phosphatase: 66 U/L (ref 38–126)
Anion gap: 6 (ref 5–15)
BILIRUBIN TOTAL: 1 mg/dL (ref 0.3–1.2)
BUN: 22 mg/dL — AB (ref 6–20)
CHLORIDE: 103 mmol/L (ref 101–111)
CO2: 30 mmol/L (ref 22–32)
CREATININE: 0.95 mg/dL (ref 0.44–1.00)
Calcium: 9.9 mg/dL (ref 8.9–10.3)
Glucose, Bld: 96 mg/dL (ref 65–99)
POTASSIUM: 4.3 mmol/L (ref 3.5–5.1)
Sodium: 139 mmol/L (ref 135–145)
TOTAL PROTEIN: 7.3 g/dL (ref 6.5–8.1)

## 2016-05-12 LAB — CBC
HCT: 40.8 % (ref 36.0–46.0)
Hemoglobin: 13.3 g/dL (ref 12.0–15.0)
MCH: 29.6 pg (ref 26.0–34.0)
MCHC: 32.6 g/dL (ref 30.0–36.0)
MCV: 90.7 fL (ref 78.0–100.0)
Platelets: 305 10*3/uL (ref 150–400)
RBC: 4.5 MIL/uL (ref 3.87–5.11)
RDW: 12.8 % (ref 11.5–15.5)
WBC: 5.7 10*3/uL (ref 4.0–10.5)

## 2016-05-12 LAB — APTT: aPTT: 28 s (ref 24–36)

## 2016-05-12 LAB — SURGICAL PCR SCREEN
MRSA, PCR: NEGATIVE
Staphylococcus aureus: NEGATIVE

## 2016-05-12 LAB — PROTIME-INR
INR: 0.9
Prothrombin Time: 12.1 s (ref 11.4–15.2)

## 2016-05-16 ENCOUNTER — Other Ambulatory Visit: Payer: Self-pay | Admitting: Orthopedic Surgery

## 2016-05-16 ENCOUNTER — Ambulatory Visit: Payer: Self-pay | Admitting: Orthopedic Surgery

## 2016-05-16 NOTE — H&P (Signed)
Dorothy Lawson DOB: April 19, 1950 Married / Language: English / Race: White Female Date of Admission:  4.16.2018 CC:  Right knee pain History of Present Illness The patient is a 66 year old female who comes in  for a preoperative History and Physical. The patient is scheduled for a right total knee arthroplasty to be performed by Dr. Dione Plover. Aluisio, MD at [facility] on 05-19-2016. The patient is a 66 year old female who presented for follow up of their knee. The patient is being followed for their right knee pain and osteoarthritis. They are now month(s) out from cortisone injection. Symptoms reported include: pain, swelling, aching, locking (after sitting for while), difficulty ambulating and difficulty arising from chair. The patient feels that they are doing poorly (started few weeks ago) and report their pain level to be mild. The following medication has been used for pain control: none. The patient has reported improvement of their symptoms with: Cortisone injections (did help alot) and ice. Her last cortisone shot has worn off. She wants to go ahead and get her knee replaced. They have been treated conservatively in the past for the above stated problem and despite conservative measures, they continue to have progressive pain and severe functional limitations and dysfunction. They have failed non-operative management including home exercise, medications, and injections. It is felt that they would benefit from undergoing total joint replacement. Risks and benefits of the procedure have been discussed with the patient and they elect to proceed with surgery. There are no active contraindications to surgery such as ongoing infection or rapidly progressive neurological disease.  Problem List/Past Medical  Status post total left knee replacement (G29.528)  Primary osteoarthritis of one knee, right (M17.11)  Urinary Tract Infection  Hypercholesterolemia  High blood pressure  Impaired  Vision  Menopause   Allergies  Macrodantin *URINARY ANTI-INFECTIVES*  Cephalexin *CEPHALOSPORINS*   Family History Cancer  mother and sister Hypertension  father  Social History  Children  2 Drug/Alcohol Rehab (Currently)  no Drug/Alcohol Rehab (Previously)  no Alcohol use  never consumed alcohol Current work status  retired Pharmacist, hospital Tobacco use  never smoker Living situation  live with spouse Number of flights of stairs before winded  2-3 Marital status  married Exercise  Exercises daily; does other Tobacco / smoke exposure  no Pain Contract  no Illicit drug use  no Advance Directives  Living Will, Healthcare POA [place] is to go home with husband.  Medication History Aspirin (81MG  Tablet, 1 (one) Oral) Active. Vitamin D3 (400UNIT Tablet, Oral) Active. Magnesium Aspartate (Oral) Specific strength unknown - Active. Multiple Vitamin (1 (one) Oral) Active. Calcium Citrate (1 (one) Oral) Specific strength unknown - Active. Atorvastatin Calcium (10MG  Tablet, Oral) Active. Famotidine (20MG  (Dis) Tablet, Oral) Active. Metoprolol Tartrate (50MG  Tablet, Oral) Active. Triamterene-HCTZ (37.5-25MG  Tablet, Oral) Active. Glucosamine Chondr 1500 Complx (Oral) Active.   Past Surgical History Tubal Ligation  Date: 07/1983. Colon Polyp Removal - Colonoscopy  Date: 01/25/2007. Total Knee Replacement - Left  Date: 05/2012.  Review of Systems  General Not Present- Chills, Fatigue, Fever, Memory Loss, Night Sweats, Weight Gain and Weight Loss. Skin Not Present- Eczema, Hives, Itching, Lesions and Rash. HEENT Not Present- Dentures, Double Vision, Headache, Hearing Loss, Tinnitus and Visual Loss. Respiratory Not Present- Allergies, Chronic Cough, Coughing up blood, Shortness of breath at rest and Shortness of breath with exertion. Cardiovascular Not Present- Chest Pain, Difficulty Breathing Lying Down, Murmur, Palpitations, Racing/skipping  heartbeats and Swelling. Gastrointestinal Not Present- Abdominal  Pain, Bloody Stool, Constipation, Diarrhea, Difficulty Swallowing, Heartburn, Jaundice, Loss of appetitie, Nausea and Vomiting. Female Genitourinary Not Present- Blood in Urine, Discharge, Flank Pain, Incontinence, Painful Urination, Urgency, Urinary frequency, Urinary Retention, Urinating at Night and Weak urinary stream. Musculoskeletal Present- Joint Pain and Spasms. Not Present- Back Pain, Joint Swelling, Morning Stiffness, Muscle Pain and Muscle Weakness. Neurological Not Present- Blackout spells, Difficulty with balance, Dizziness, Paralysis, Tremor and Weakness. Psychiatric Not Present- Insomnia.  Vitals Weight: 170 lb Height: 66in Weight was reported by patient. Height was reported by patient. Body Surface Area: 1.87 m Body Mass Index: 27.44 kg/m  Pulse: 56 (Regular)  BP: 118/64 (Sitting, Right Arm, Standard  Physical Exam General Mental Status -Alert, cooperative and good historian. General Appearance-pleasant, Not in acute distress. Orientation-Oriented X3. Build & Nutrition-Well nourished and Well developed.  Head and Neck Head-normocephalic, atraumatic . Neck Global Assessment - supple, no bruit auscultated on the right, no bruit auscultated on the left.  Eye Vision-Wears corrective lenses. Pupil - Bilateral-Regular and Round. Motion - Bilateral-EOMI.  Chest and Lung Exam Auscultation Breath sounds - clear at anterior chest wall and clear at posterior chest wall. Adventitious sounds - No Adventitious sounds.  Cardiovascular Auscultation Rhythm - Regular rate and rhythm. Heart Sounds - S1 WNL and S2 WNL. Murmurs & Other Heart Sounds - Auscultation of the heart reveals - No Murmurs.  Abdomen Palpation/Percussion Tenderness - Abdomen is non-tender to palpation. Rigidity (guarding) - Abdomen is soft. Auscultation Auscultation of the abdomen reveals - Bowel sounds  normal.  Female Genitourinary Note: Not done, not pertinent to present illness   Musculoskeletal Note: On exam, she is in no distress. Her right knee shows no swelling. Her range is about 5 to 125 with marked crepitus on range of motion. Slight varus deformity. Tender medial and lateral with no instability.  RADIOGRAPHS We reviewed her x-rays and she has tricompartmental degenerative changes, bone on bone medial and patellofemoral. Subchondral cysts and large osteophytes.  Assessment & Plan Status post total left knee replacement (W97.989) Primary osteoarthritis of one knee, right (M17.11)  Note:Surgical Plans: Right Total Knee Replacement  Disposition: Home with Husband, Setup outpatient at [facility] in [place], [place]  PCP: Dr. Matthias Hughs - pending  IV TXA  Anesthesia Issues: None  Patient was instructed on what medications to stop prior to surgery.  Signed electronically by Joelene Millin, III PA-C

## 2016-05-19 ENCOUNTER — Inpatient Hospital Stay (HOSPITAL_COMMUNITY): Payer: Medicare Other | Admitting: Registered Nurse

## 2016-05-19 ENCOUNTER — Encounter (HOSPITAL_COMMUNITY)
Admission: RE | Disposition: A | Discharge status: Home / Self Care | Payer: Self-pay | Source: Ambulatory Visit | Attending: Orthopedic Surgery

## 2016-05-19 ENCOUNTER — Inpatient Hospital Stay (HOSPITAL_COMMUNITY)
Admission: RE | Admit: 2016-05-19 | Discharge: 2016-05-21 | DRG: 470 | Disposition: A | Discharge status: Home / Self Care | Payer: Medicare Other | Source: Ambulatory Visit | Attending: Orthopedic Surgery | Admitting: Orthopedic Surgery

## 2016-05-19 ENCOUNTER — Encounter (HOSPITAL_COMMUNITY): Payer: Self-pay | Admitting: *Deleted

## 2016-05-19 DIAGNOSIS — M25561 Pain in right knee: Secondary | ICD-10-CM | POA: Diagnosis present

## 2016-05-19 DIAGNOSIS — K219 Gastro-esophageal reflux disease without esophagitis: Secondary | ICD-10-CM | POA: Diagnosis present

## 2016-05-19 DIAGNOSIS — E7800 Pure hypercholesterolemia, unspecified: Secondary | ICD-10-CM | POA: Diagnosis present

## 2016-05-19 DIAGNOSIS — I10 Essential (primary) hypertension: Secondary | ICD-10-CM | POA: Diagnosis present

## 2016-05-19 DIAGNOSIS — M179 Osteoarthritis of knee, unspecified: Secondary | ICD-10-CM | POA: Diagnosis present

## 2016-05-19 DIAGNOSIS — M1710 Unilateral primary osteoarthritis, unspecified knee: Secondary | ICD-10-CM | POA: Diagnosis present

## 2016-05-19 DIAGNOSIS — M1711 Unilateral primary osteoarthritis, right knee: Secondary | ICD-10-CM

## 2016-05-19 HISTORY — PX: TOTAL KNEE ARTHROPLASTY: SHX125

## 2016-05-19 LAB — TYPE AND SCREEN
ABO/RH(D): O POS
Antibody Screen: NEGATIVE

## 2016-05-19 SURGERY — ARTHROPLASTY, KNEE, TOTAL
Anesthesia: Spinal | Site: Knee | Laterality: Right

## 2016-05-19 MED ORDER — CEFAZOLIN SODIUM-DEXTROSE 2-4 GM/100ML-% IV SOLN
INTRAVENOUS | Status: AC
Start: 2016-05-19 — End: 2016-05-19
  Filled 2016-05-19: qty 100

## 2016-05-19 MED ORDER — ROPIVACAINE HCL 5 MG/ML IJ SOLN
INTRAMUSCULAR | Status: DC | PRN
  Administered 2016-05-19: 25 mL via PERINEURAL

## 2016-05-19 MED ORDER — TRAMADOL HCL 50 MG PO TABS
50.0000 mg | ORAL_TABLET | Freq: Four times a day (QID) | ORAL | Status: DC | PRN
  Administered 2016-05-20: 100 mg via ORAL
  Filled 2016-05-19: qty 2

## 2016-05-19 MED ORDER — BISACODYL 10 MG RE SUPP: 10.0000 mg | Freq: Every day | RECTAL | Status: DC | PRN

## 2016-05-19 MED ORDER — ONDANSETRON HCL 4 MG/2ML IJ SOLN
INTRAMUSCULAR | Status: DC | PRN
  Administered 2016-05-19: 4 mg via INTRAVENOUS

## 2016-05-19 MED ORDER — LACTATED RINGERS IV SOLN
INTRAVENOUS | Status: DC
  Administered 2016-05-19: 10:00:00 via INTRAVENOUS

## 2016-05-19 MED ORDER — MENTHOL 3 MG MT LOZG: 1.0000 | LOZENGE | OROMUCOSAL | Status: DC | PRN

## 2016-05-19 MED ORDER — MIDAZOLAM HCL 5 MG/5ML IJ SOLN
INTRAMUSCULAR | Status: DC | PRN
  Administered 2016-05-19: 2 mg via INTRAVENOUS

## 2016-05-19 MED ORDER — CEFAZOLIN SODIUM-DEXTROSE 2-4 GM/100ML-% IV SOLN
2.0000 g | Freq: Four times a day (QID) | INTRAVENOUS | Status: AC
Start: 2016-05-19 — End: 2016-05-20
  Administered 2016-05-19 – 2016-05-20 (×2): 2 g via INTRAVENOUS
  Filled 2016-05-19 (×2): qty 100

## 2016-05-19 MED ORDER — GABAPENTIN 300 MG PO CAPS
300.0000 mg | ORAL_CAPSULE | Freq: Once | ORAL | Status: AC
  Administered 2016-05-19: 300 mg via ORAL

## 2016-05-19 MED ORDER — MORPHINE SULFATE (PF) 2 MG/ML IV SOLN
1.0000 mg | INTRAVENOUS | Status: DC | PRN
  Administered 2016-05-19: 1 mg via INTRAVENOUS
  Filled 2016-05-19: qty 1

## 2016-05-19 MED ORDER — ONDANSETRON HCL 4 MG/2ML IJ SOLN: 4.0000 mg | Freq: Four times a day (QID) | INTRAMUSCULAR | Status: DC | PRN

## 2016-05-19 MED ORDER — DEXAMETHASONE SODIUM PHOSPHATE 10 MG/ML IJ SOLN
10.0000 mg | Freq: Once | INTRAMUSCULAR | Status: AC
  Administered 2016-05-19: 10 mg via INTRAVENOUS

## 2016-05-19 MED ORDER — MEPERIDINE HCL 50 MG/ML IJ SOLN: 6.2500 mg | INTRAMUSCULAR | Status: DC | PRN

## 2016-05-19 MED ORDER — SODIUM CHLORIDE 0.9 % IR SOLN
Status: DC | PRN
  Administered 2016-05-19: 1000 mL

## 2016-05-19 MED ORDER — POLYETHYLENE GLYCOL 3350 17 G PO PACK
17.0000 g | PACK | Freq: Every day | ORAL | Status: DC | PRN
Start: 2016-05-19 — End: 2016-05-21

## 2016-05-19 MED ORDER — ONDANSETRON HCL 4 MG PO TABS: 4.0000 mg | ORAL_TABLET | Freq: Four times a day (QID) | ORAL | Status: DC | PRN

## 2016-05-19 MED ORDER — METHOCARBAMOL 1000 MG/10ML IJ SOLN
500.0000 mg | Freq: Four times a day (QID) | INTRAVENOUS | Status: DC | PRN
  Filled 2016-05-19: qty 5

## 2016-05-19 MED ORDER — LACTATED RINGERS IV SOLN: INTRAVENOUS | Status: DC

## 2016-05-19 MED ORDER — METOCLOPRAMIDE HCL 5 MG PO TABS: 5.0000 mg | ORAL_TABLET | Freq: Three times a day (TID) | ORAL | Status: DC | PRN

## 2016-05-19 MED ORDER — PROPOFOL 500 MG/50ML IV EMUL
INTRAVENOUS | Status: DC | PRN
  Administered 2016-05-19: 40 ug/kg/min via INTRAVENOUS

## 2016-05-19 MED ORDER — BUPIVACAINE LIPOSOME 1.3 % IJ SUSP
INTRAMUSCULAR | Status: DC | PRN
  Administered 2016-05-19: 20 mL

## 2016-05-19 MED ORDER — ONDANSETRON HCL 4 MG/2ML IJ SOLN
INTRAMUSCULAR | Status: AC
  Filled 2016-05-19: qty 2

## 2016-05-19 MED ORDER — RIVAROXABAN 10 MG PO TABS
10.0000 mg | ORAL_TABLET | Freq: Every day | ORAL | Status: DC
  Administered 2016-05-20 – 2016-05-21 (×2): 10 mg via ORAL
  Filled 2016-05-19 (×2): qty 1

## 2016-05-19 MED ORDER — FAMOTIDINE 20 MG PO TABS
20.0000 mg | ORAL_TABLET | Freq: Every day | ORAL | Status: DC
  Administered 2016-05-19 – 2016-05-20 (×2): 20 mg via ORAL
  Filled 2016-05-19 (×2): qty 1

## 2016-05-19 MED ORDER — HYDROMORPHONE HCL 1 MG/ML IJ SOLN
0.2500 mg | INTRAMUSCULAR | Status: DC | PRN
  Filled 2016-05-19: qty 0.5

## 2016-05-19 MED ORDER — SODIUM CHLORIDE 0.9 % IV SOLN
INTRAVENOUS | Status: DC
  Administered 2016-05-19: 17:00:00 via INTRAVENOUS

## 2016-05-19 MED ORDER — SODIUM CHLORIDE 0.9 % IJ SOLN
INTRAMUSCULAR | Status: AC
  Filled 2016-05-19: qty 50

## 2016-05-19 MED ORDER — ACETAMINOPHEN 325 MG PO TABS: 650.0000 mg | ORAL_TABLET | Freq: Four times a day (QID) | ORAL | Status: DC | PRN

## 2016-05-19 MED ORDER — BUPIVACAINE HCL (PF) 0.5 % IJ SOLN
INTRAMUSCULAR | Status: DC | PRN
  Administered 2016-05-19: 3 mL

## 2016-05-19 MED ORDER — CEFAZOLIN SODIUM-DEXTROSE 2-4 GM/100ML-% IV SOLN
2.0000 g | INTRAVENOUS | Status: AC
  Administered 2016-05-19: 2 g via INTRAVENOUS

## 2016-05-19 MED ORDER — FENTANYL CITRATE (PF) 100 MCG/2ML IJ SOLN
INTRAMUSCULAR | Status: DC | PRN
  Administered 2016-05-19: 50 ug via INTRAVENOUS

## 2016-05-19 MED ORDER — SODIUM CHLORIDE 0.9 % IJ SOLN
INTRAMUSCULAR | Status: DC | PRN
  Administered 2016-05-19: 30 mL

## 2016-05-19 MED ORDER — DEXAMETHASONE SODIUM PHOSPHATE 10 MG/ML IJ SOLN
INTRAMUSCULAR | Status: AC
  Filled 2016-05-19: qty 1

## 2016-05-19 MED ORDER — METOCLOPRAMIDE HCL 5 MG/ML IJ SOLN: 5.0000 mg | Freq: Three times a day (TID) | INTRAMUSCULAR | Status: DC | PRN

## 2016-05-19 MED ORDER — ACETAMINOPHEN 650 MG RE SUPP: 650.0000 mg | Freq: Four times a day (QID) | RECTAL | Status: DC | PRN

## 2016-05-19 MED ORDER — DIPHENHYDRAMINE HCL 12.5 MG/5ML PO ELIX: 12.5000 mg | ORAL_SOLUTION | ORAL | Status: DC | PRN

## 2016-05-19 MED ORDER — ACETAMINOPHEN 10 MG/ML IV SOLN
1000.0000 mg | Freq: Once | INTRAVENOUS | Status: AC
  Administered 2016-05-19: 1000 mg via INTRAVENOUS

## 2016-05-19 MED ORDER — GABAPENTIN 300 MG PO CAPS
ORAL_CAPSULE | ORAL | Status: AC
  Administered 2016-05-19: 300 mg via ORAL
  Filled 2016-05-19: qty 1

## 2016-05-19 MED ORDER — PHENOL 1.4 % MT LIQD
1.0000 | OROMUCOSAL | Status: DC | PRN
Start: 2016-05-19 — End: 2016-05-21

## 2016-05-19 MED ORDER — OXYCODONE HCL 5 MG PO TABS
5.0000 mg | ORAL_TABLET | ORAL | Status: DC | PRN
  Administered 2016-05-19 – 2016-05-20 (×7): 10 mg via ORAL
  Administered 2016-05-20: 5 mg via ORAL
  Administered 2016-05-20 – 2016-05-21 (×5): 10 mg via ORAL
  Filled 2016-05-19 (×13): qty 2

## 2016-05-19 MED ORDER — FLEET ENEMA 7-19 GM/118ML RE ENEM: 1.0000 | ENEMA | Freq: Once | RECTAL | Status: DC | PRN

## 2016-05-19 MED ORDER — TRANEXAMIC ACID 1000 MG/10ML IV SOLN
1000.0000 mg | INTRAVENOUS | Status: AC
  Administered 2016-05-19: 1000 mg via INTRAVENOUS
  Filled 2016-05-19: qty 1100

## 2016-05-19 MED ORDER — PROPOFOL 10 MG/ML IV BOLUS
INTRAVENOUS | Status: AC
  Filled 2016-05-19: qty 40

## 2016-05-19 MED ORDER — BUPIVACAINE HCL (PF) 0.5 % IJ SOLN
INTRAMUSCULAR | Status: AC
  Filled 2016-05-19: qty 30

## 2016-05-19 MED ORDER — TRANEXAMIC ACID 1000 MG/10ML IV SOLN
1000.0000 mg | Freq: Once | INTRAVENOUS | Status: AC
  Administered 2016-05-19: 1000 mg via INTRAVENOUS
  Filled 2016-05-19: qty 1100

## 2016-05-19 MED ORDER — LIDOCAINE 2% (20 MG/ML) 5 ML SYRINGE
INTRAMUSCULAR | Status: AC
  Filled 2016-05-19: qty 5

## 2016-05-19 MED ORDER — METHOCARBAMOL 500 MG PO TABS
500.0000 mg | ORAL_TABLET | Freq: Four times a day (QID) | ORAL | Status: DC | PRN
  Administered 2016-05-19 – 2016-05-21 (×4): 500 mg via ORAL
  Filled 2016-05-19 (×4): qty 1

## 2016-05-19 MED ORDER — LIDOCAINE 2% (20 MG/ML) 5 ML SYRINGE
INTRAMUSCULAR | Status: DC | PRN
  Administered 2016-05-19: 100 mg via INTRAVENOUS

## 2016-05-19 MED ORDER — METOPROLOL TARTRATE 50 MG PO TABS
50.0000 mg | ORAL_TABLET | Freq: Two times a day (BID) | ORAL | Status: DC
  Administered 2016-05-19 – 2016-05-21 (×3): 50 mg via ORAL
  Filled 2016-05-19 (×4): qty 1

## 2016-05-19 MED ORDER — MAGNESIUM 200 MG PO TABS
200.0000 mg | ORAL_TABLET | Freq: Every day | ORAL | Status: DC
  Administered 2016-05-19 – 2016-05-20 (×2): 200 mg via ORAL
  Filled 2016-05-19 (×4): qty 1

## 2016-05-19 MED ORDER — DOCUSATE SODIUM 100 MG PO CAPS
100.0000 mg | ORAL_CAPSULE | Freq: Two times a day (BID) | ORAL | Status: DC
  Administered 2016-05-19 – 2016-05-21 (×4): 100 mg via ORAL
  Filled 2016-05-19 (×4): qty 1

## 2016-05-19 MED ORDER — TRIAMTERENE-HCTZ 37.5-25 MG PO TABS
0.5000 | ORAL_TABLET | Freq: Every day | ORAL | Status: DC
  Administered 2016-05-20: 0.5 via ORAL
  Filled 2016-05-19 (×2): qty 1

## 2016-05-19 MED ORDER — DEXAMETHASONE SODIUM PHOSPHATE 10 MG/ML IJ SOLN
10.0000 mg | Freq: Once | INTRAMUSCULAR | Status: AC
  Administered 2016-05-20: 10 mg via INTRAVENOUS
  Filled 2016-05-19: qty 1

## 2016-05-19 MED ORDER — BUPIVACAINE LIPOSOME 1.3 % IJ SUSP
20.0000 mL | Freq: Once | INTRAMUSCULAR | Status: DC
  Filled 2016-05-19: qty 20

## 2016-05-19 MED ORDER — ATORVASTATIN CALCIUM 10 MG PO TABS
10.0000 mg | ORAL_TABLET | Freq: Every day | ORAL | Status: DC
  Administered 2016-05-19 – 2016-05-20 (×2): 10 mg via ORAL
  Filled 2016-05-19 (×2): qty 1

## 2016-05-19 MED ORDER — ACETAMINOPHEN 10 MG/ML IV SOLN
INTRAVENOUS | Status: AC
  Filled 2016-05-19: qty 100

## 2016-05-19 MED ORDER — ACETAMINOPHEN 500 MG PO TABS
1000.0000 mg | ORAL_TABLET | Freq: Four times a day (QID) | ORAL | Status: AC
  Administered 2016-05-19 – 2016-05-20 (×4): 1000 mg via ORAL
  Filled 2016-05-19 (×4): qty 2

## 2016-05-19 MED ORDER — FENTANYL CITRATE (PF) 100 MCG/2ML IJ SOLN
INTRAMUSCULAR | Status: AC
  Filled 2016-05-19: qty 2

## 2016-05-19 MED ORDER — PROMETHAZINE HCL 25 MG/ML IJ SOLN: 6.2500 mg | INTRAMUSCULAR | Status: DC | PRN

## 2016-05-19 SURGICAL SUPPLY — 51 items
BAG DECANTER FOR FLEXI CONT (MISCELLANEOUS) ×2 IMPLANT
BAG ZIPLOCK 12X15 (MISCELLANEOUS) ×2 IMPLANT
BANDAGE ACE 6X5 VEL STRL LF (GAUZE/BANDAGES/DRESSINGS) ×2 IMPLANT
BANDAGE ELASTIC 6 VELCRO ST LF (GAUZE/BANDAGES/DRESSINGS) ×2 IMPLANT
BLADE SAG 18X100X1.27 (BLADE) ×2 IMPLANT
BLADE SAW SGTL 11.0X1.19X90.0M (BLADE) ×2 IMPLANT
BOWL SMART MIX CTS (DISPOSABLE) ×2 IMPLANT
CAP KNEE TOTAL 3 SIGMA ×2 IMPLANT
CEMENT HV SMART SET (Cement) ×4 IMPLANT
COVER SURGICAL LIGHT HANDLE (MISCELLANEOUS) ×2 IMPLANT
CUFF TOURN SGL QUICK 34 (TOURNIQUET CUFF) ×1
CUFF TRNQT CYL 34X4X40X1 (TOURNIQUET CUFF) ×1 IMPLANT
DECANTER SPIKE VIAL GLASS SM (MISCELLANEOUS) ×2 IMPLANT
DRAPE U-SHAPE 47X51 STRL (DRAPES) ×2 IMPLANT
DRSG ADAPTIC 3X8 NADH LF (GAUZE/BANDAGES/DRESSINGS) ×2 IMPLANT
DRSG PAD ABDOMINAL 8X10 ST (GAUZE/BANDAGES/DRESSINGS) ×2 IMPLANT
DURAPREP 26ML APPLICATOR (WOUND CARE) ×2 IMPLANT
ELECT REM PT RETURN 15FT ADLT (MISCELLANEOUS) ×2 IMPLANT
EVACUATOR 1/8 PVC DRAIN (DRAIN) ×2 IMPLANT
GAUZE SPONGE 4X4 12PLY STRL (GAUZE/BANDAGES/DRESSINGS) ×2 IMPLANT
GLOVE BIO SURGEON STRL SZ7.5 (GLOVE) IMPLANT
GLOVE BIO SURGEON STRL SZ8 (GLOVE) ×2 IMPLANT
GLOVE BIOGEL PI IND STRL 6.5 (GLOVE) IMPLANT
GLOVE BIOGEL PI IND STRL 8 (GLOVE) ×1 IMPLANT
GLOVE BIOGEL PI INDICATOR 6.5 (GLOVE)
GLOVE BIOGEL PI INDICATOR 8 (GLOVE) ×1
GLOVE SURG SS PI 6.5 STRL IVOR (GLOVE) IMPLANT
GOWN STRL REUS W/TWL LRG LVL3 (GOWN DISPOSABLE) ×2 IMPLANT
GOWN STRL REUS W/TWL XL LVL3 (GOWN DISPOSABLE) IMPLANT
HANDPIECE INTERPULSE COAX TIP (DISPOSABLE) ×1
IMMOBILIZER KNEE 20 (SOFTGOODS) ×2
IMMOBILIZER KNEE 20 THIGH 36 (SOFTGOODS) ×1 IMPLANT
MANIFOLD NEPTUNE II (INSTRUMENTS) ×2 IMPLANT
NS IRRIG 1000ML POUR BTL (IV SOLUTION) ×2 IMPLANT
PACK TOTAL KNEE CUSTOM (KITS) ×2 IMPLANT
PAD ABD 8X10 STRL (GAUZE/BANDAGES/DRESSINGS) ×2 IMPLANT
PADDING CAST COTTON 6X4 STRL (CAST SUPPLIES) ×2 IMPLANT
POSITIONER SURGICAL ARM (MISCELLANEOUS) ×2 IMPLANT
SET HNDPC FAN SPRY TIP SCT (DISPOSABLE) ×1 IMPLANT
STRIP CLOSURE SKIN 1/2X4 (GAUZE/BANDAGES/DRESSINGS) ×2 IMPLANT
SUT MNCRL AB 4-0 PS2 18 (SUTURE) ×2 IMPLANT
SUT STRATAFIX 0 PDS 27 VIOLET (SUTURE) ×2
SUT VIC AB 2-0 CT1 27 (SUTURE) ×3
SUT VIC AB 2-0 CT1 TAPERPNT 27 (SUTURE) ×3 IMPLANT
SUTURE STRATFX 0 PDS 27 VIOLET (SUTURE) ×1 IMPLANT
SYR 50ML LL SCALE MARK (SYRINGE) IMPLANT
TRAY FOLEY W/METER SILVER 14FR (SET/KITS/TRAYS/PACK) ×2 IMPLANT
TRAY FOLEY W/METER SILVER 16FR (SET/KITS/TRAYS/PACK) ×2 IMPLANT
WATER STERILE IRR 1000ML POUR (IV SOLUTION) ×4 IMPLANT
WRAP KNEE MAXI GEL POST OP (GAUZE/BANDAGES/DRESSINGS) ×2 IMPLANT
YANKAUER SUCT BULB TIP 10FT TU (MISCELLANEOUS) ×2 IMPLANT

## 2016-05-19 NOTE — Transfer of Care (Signed)
Immediate Anesthesia Transfer of Care Note  Patient: Dorothy Lawson  Procedure(s) Performed: Procedure(s) with comments: RIGHT TOTAL KNEE ARTHROPLASTY (Right) - requests 46mns  Patient Location: PACU  Anesthesia Type:Spinal and MAC combined with regional for post-op pain  Level of Consciousness: awake, alert  and oriented  Airway & Oxygen Therapy: Patient Spontanous Breathing and Patient connected to face mask oxygen  Post-op Assessment: Report given to RN and Post -op Vital signs reviewed and stable  Post vital signs: Reviewed and stable  Last Vitals:  Vitals:   05/19/16 1102 05/19/16 1103  BP: 109/66   Pulse: 62 60  Resp: 11 12  Temp:      Last Pain:  Vitals:   05/19/16 1010  TempSrc:   PainSc: 2       Patients Stated Pain Goal: 4 ([id number]  Complications: No apparent anesthesia complications

## 2016-05-19 NOTE — Op Note (Signed)
OPERATIVE REPORT-TOTAL KNEE ARTHROPLASTY   Pre-operative diagnosis- Osteoarthritis  Right knee(s)  Post-operative diagnosis- Osteoarthritis Right knee(s)  Procedure-  Right  Total Knee Arthroplasty  Surgeon- Dione Plover. Kenny Stern, MD  Assistant- Ardeen Jourdain, PA-C   Anesthesia-  Adductor canal block and spinal  EBL-* No blood loss amount entered *   Drains Hemovac  Tourniquet time-  Total Tourniquet Time Documented: Thigh (Right) - 29 minutes Total: Thigh (Right) - 29 minutes     Complications- None  Condition-PACU - hemodynamically stable.   Brief Clinical Note  Dorothy Lawson is a 66 y.o. year old female with end stage OA of her right knee with progressively worsening pain and dysfunction. She has constant pain, with activity and at rest and significant functional deficits with difficulties even with ADLs. She has had extensive non-op management including analgesics, injections of cortisone and viscosupplements, and home exercise program, but remains in significant pain with significant dysfunction.Radiographs show bone on bone arthritis medial and patellofemoral. She presents now for right Total Knee Arthroplasty.    Procedure in detail---   The patient is brought into the operating room and positioned supine on the operating table. After successful administration of  Adductor canal block and spinal,   a tourniquet is placed high on the  Right thigh(s) and the lower extremity is prepped and draped in the usual sterile fashion. Time out is performed by the operating team and then the  Right lower extremity is wrapped in Esmarch, knee flexed and the tourniquet inflated to 300 mmHg.       A midline incision is made with a ten blade through the subcutaneous tissue to the level of the extensor mechanism. A fresh blade is used to make a medial parapatellar arthrotomy. Soft tissue over the proximal medial tibia is subperiosteally elevated to the joint line with a knife and into the  semimembranosus bursa with a Cobb elevator. Soft tissue over the proximal lateral tibia is elevated with attention being paid to avoiding the patellar tendon on the tibial tubercle. The patella is everted, knee flexed 90 degrees and the ACL and PCL are removed. Findings are bone on bone medial and patellofemoral with large global osteophytes.        The drill is used to create a starting hole in the distal femur and the canal is thoroughly irrigated with sterile saline to remove the fatty contents. The 5 degree Right  valgus alignment guide is placed into the femoral canal and the distal femoral cutting block is pinned to remove 10 mm off the distal femur. Resection is made with an oscillating saw.      The tibia is subluxed forward and the menisci are removed. The extramedullary alignment guide is placed referencing proximally at the medial aspect of the tibial tubercle and distally along the second metatarsal axis and tibial crest. The block is pinned to remove 81mm off the more deficient medial  side. Resection is made with an oscillating saw. Size 2.5is the most appropriate size for the tibia and the proximal tibia is prepared with the modular drill and keel punch for that size.      The femoral sizing guide is placed and size 2.5 is most appropriate. Rotation is marked off the epicondylar axis and confirmed by creating a rectangular flexion gap at 90 degrees. The size 2.5 cutting block is pinned in this rotation and the anterior, posterior and chamfer cuts are made with the oscillating saw. The intercondylar block is then placed and that  cut is made.      Trial size 2.5 tibial component, trial size 2.5 posterior stabilized femur and a 10  mm posterior stabilized rotating platform insert trial is placed. Full extension is achieved with excellent varus/valgus and anterior/posterior balance throughout full range of motion. The patella is everted and thickness measured to be 22  mm. Free hand resection is taken  to 12 mm, a 35 template is placed, lug holes are drilled, trial patella is placed, and it tracks normally. Osteophytes are removed off the posterior femur with the trial in place. All trials are removed and the cut bone surfaces prepared with pulsatile lavage. Cement is mixed and once ready for implantation, the size 2.5 tibial implant, size  2.5 posterior stabilized femoral component, and the size 35 patella are cemented in place and the patella is held with the clamp. The trial insert is placed and the knee held in full extension. The Exparel (20 ml mixed with 30 ml saline) is injected into the extensor mechanism, posterior capsule, medial and lateral gutters and subcutaneous tissues.  All extruded cement is removed and once the cement is hard the permanent 10 mm posterior stabilized rotating platform insert is placed into the tibial tray.      The wound is copiously irrigated with saline solution and the extensor mechanism closed over a hemovac drain with #1 V-loc suture. The tourniquet is released for a total tourniquet time of 29  minutes. Flexion against gravity is 140 degrees and the patella tracks normally. Subcutaneous tissue is closed with 2.0 vicryl and subcuticular with running 4.0 Monocryl. The incision is cleaned and dried and steri-strips and a bulky sterile dressing are applied. The limb is placed into a knee immobilizer and the patient is awakened and transported to recovery in stable condition.      Please note that a surgical assistant was a medical necessity for this procedure in order to perform it in a safe and expeditious manner. Surgical assistant was necessary to retract the ligaments and vital neurovascular structures to prevent injury to them and also necessary for proper positioning of the limb to allow for anatomic placement of the prosthesis.   Dione Plover Saafir Abdullah, MD    05/19/2016, 12:17 PM

## 2016-05-19 NOTE — Anesthesia Procedure Notes (Signed)
Anesthesia Regional Block: Adductor canal block   Pre-Anesthetic Checklist: ,, timeout performed, Correct Patient, Correct Site, Correct Laterality, Correct Procedure, Correct Position, site marked, Risks and benefits discussed,  Surgical consent,  Pre-op evaluation,  At surgeon's request and post-op pain management  Laterality: Right  Prep: chloraprep       Needles:  Injection technique: Single-shot  Needle Type: Echogenic Needle     Needle Length: 9cm  Needle Gauge: 21     Additional Needles:   Procedures: ultrasound guided,,,,,,,,  Narrative:  Start time: 05/19/2016 10:55 AM End time: 05/19/2016 11:00 AM Injection made incrementally with aspirations every 5 mL.  Performed by: Personally  Anesthesiologist: Suella Broad D  Additional Notes: Tolerated well.

## 2016-05-19 NOTE — H&P (View-Only) (Signed)
Dorothy Lawson DOB: [birth year] Married / Language: English / Race: White Female Date of Admission:  4.16.2018 CC:  Right knee pain History of Present Illness The patient is a 66 year old female who comes in  for a preoperative History and Physical. The patient is scheduled for a right total knee arthroplasty to be performed by Dr. Dione Plover. Aluisio, MD at [facility] on 05-19-2016. The patient is a 66 year old female who presented for follow up of their knee. The patient is being followed for their right knee pain and osteoarthritis. They are now month(s) out from cortisone injection. Symptoms reported include: pain, swelling, aching, locking (after sitting for while), difficulty ambulating and difficulty arising from chair. The patient feels that they are doing poorly (started few weeks ago) and report their pain level to be mild. The following medication has been used for pain control: none. The patient has reported improvement of their symptoms with: Cortisone injections (did help alot) and ice. Her last cortisone shot has worn off. She wants to go ahead and get her knee replaced. They have been treated conservatively in the past for the above stated problem and despite conservative measures, they continue to have progressive pain and severe functional limitations and dysfunction. They have failed non-operative management including home exercise, medications, and injections. It is felt that they would benefit from undergoing total joint replacement. Risks and benefits of the procedure have been discussed with the patient and they elect to proceed with surgery. There are no active contraindications to surgery such as ongoing infection or rapidly progressive neurological disease.  Problem List/Past Medical  Status post total left knee replacement (K44.010)  Primary osteoarthritis of one knee, right (M17.11)  Urinary Tract Infection  Hypercholesterolemia  High blood pressure  Impaired  Vision  Menopause   Allergies  Macrodantin *URINARY ANTI-INFECTIVES*  Cephalexin *CEPHALOSPORINS*   Family History Cancer  mother and sister Hypertension  father  Social History  Children  2 Drug/Alcohol Rehab (Currently)  no Drug/Alcohol Rehab (Previously)  no Alcohol use  never consumed alcohol Current work status  retired Pharmacist, hospital Tobacco use  never smoker Living situation  live with spouse Number of flights of stairs before winded  2-3 Marital status  married Exercise  Exercises daily; does other Tobacco / smoke exposure  no Pain Contract  no Illicit drug use  no Advance Directives  Living Will, Healthcare POA [place] is to go home with husband.  Medication History Aspirin (81MG  Tablet, 1 (one) Oral) Active. Vitamin D3 (400UNIT Tablet, Oral) Active. Magnesium Aspartate (Oral) Specific strength unknown - Active. Multiple Vitamin (1 (one) Oral) Active. Calcium Citrate (1 (one) Oral) Specific strength unknown - Active. Atorvastatin Calcium (10MG  Tablet, Oral) Active. Famotidine (20MG  (Dis) Tablet, Oral) Active. Metoprolol Tartrate (50MG  Tablet, Oral) Active. Triamterene-HCTZ (37.5-25MG  Tablet, Oral) Active. Glucosamine Chondr 1500 Complx (Oral) Active.   Past Surgical History Tubal Ligation  Date: 07/1983. Colon Polyp Removal - Colonoscopy  Date: 01/25/2007. Total Knee Replacement - Left  Date: 05/2012.  Review of Systems  General Not Present- Chills, Fatigue, Fever, Memory Loss, Night Sweats, Weight Gain and Weight Loss. Skin Not Present- Eczema, Hives, Itching, Lesions and Rash. HEENT Not Present- Dentures, Double Vision, Headache, Hearing Loss, Tinnitus and Visual Loss. Respiratory Not Present- Allergies, Chronic Cough, Coughing up blood, Shortness of breath at rest and Shortness of breath with exertion. Cardiovascular Not Present- Chest Pain, Difficulty Breathing Lying Down, Murmur, Palpitations, Racing/skipping  heartbeats and Swelling. Gastrointestinal Not Present- Abdominal  Pain, Bloody Stool, Constipation, Diarrhea, Difficulty Swallowing, Heartburn, Jaundice, Loss of appetitie, Nausea and Vomiting. Female Genitourinary Not Present- Blood in Urine, Discharge, Flank Pain, Incontinence, Painful Urination, Urgency, Urinary frequency, Urinary Retention, Urinating at Night and Weak urinary stream. Musculoskeletal Present- Joint Pain and Spasms. Not Present- Back Pain, Joint Swelling, Morning Stiffness, Muscle Pain and Muscle Weakness. Neurological Not Present- Blackout spells, Difficulty with balance, Dizziness, Paralysis, Tremor and Weakness. Psychiatric Not Present- Insomnia.  Vitals Weight: 170 lb Height: 66in Weight was reported by patient. Height was reported by patient. Body Surface Area: 1.87 m Body Mass Index: 27.44 kg/m  Pulse: 56 (Regular)  BP: 118/64 (Sitting, Right Arm, Standard  Physical Exam General Mental Status -Alert, cooperative and good historian. General Appearance-pleasant, Not in acute distress. Orientation-Oriented X3. Build & Nutrition-Well nourished and Well developed.  Head and Neck Head-normocephalic, atraumatic . Neck Global Assessment - supple, no bruit auscultated on the right, no bruit auscultated on the left.  Eye Vision-Wears corrective lenses. Pupil - Bilateral-Regular and Round. Motion - Bilateral-EOMI.  Chest and Lung Exam Auscultation Breath sounds - clear at anterior chest wall and clear at posterior chest wall. Adventitious sounds - No Adventitious sounds.  Cardiovascular Auscultation Rhythm - Regular rate and rhythm. Heart Sounds - S1 WNL and S2 WNL. Murmurs & Other Heart Sounds - Auscultation of the heart reveals - No Murmurs.  Abdomen Palpation/Percussion Tenderness - Abdomen is non-tender to palpation. Rigidity (guarding) - Abdomen is soft. Auscultation Auscultation of the abdomen reveals - Bowel sounds  normal.  Female Genitourinary Note: Not done, not pertinent to present illness   Musculoskeletal Note: On exam, she is in no distress. Her right knee shows no swelling. Her range is about 5 to 125 with marked crepitus on range of motion. Slight varus deformity. Tender medial and lateral with no instability.  RADIOGRAPHS We reviewed her x-rays and she has tricompartmental degenerative changes, bone on bone medial and patellofemoral. Subchondral cysts and large osteophytes.  Assessment & Plan Status post total left knee replacement (F79.038) Primary osteoarthritis of one knee, right (M17.11)  Note:Surgical Plans: Right Total Knee Replacement  Disposition: Home with Husband, Setup outpatient at [facility] in [place], [place]  PCP: Dr. Matthias Hughs - pending  IV TXA  Anesthesia Issues: None  Patient was instructed on what medications to stop prior to surgery.  Signed electronically by Joelene Millin, III PA-C

## 2016-05-19 NOTE — Interval H&P Note (Signed)
History and Physical Interval Note:  05/19/2016 10:23 AM  Dorothy Lawson  has presented today for surgery, with the diagnosis of Osteoarthritis Right Knee  The various methods of treatment have been discussed with the patient and family. After consideration of risks, benefits and other options for treatment, the patient has consented to  Procedure(s) with comments: RIGHT TOTAL KNEE ARTHROPLASTY (Right) - requests 39mins as a surgical intervention .  The patient's history has been reviewed, patient examined, no change in status, stable for surgery.  I have reviewed the patient's chart and labs.  Questions were answered to the patient's satisfaction.     [name]

## 2016-05-19 NOTE — Anesthesia Procedure Notes (Signed)
Spinal  Patient location during procedure: OR Start time: 05/19/2016 11:19 AM End time: 05/19/2016 11:27 AM Staffing Resident/CRNA: ARMISTEAD, LACEY A Performed: resident/CRNA  Preanesthetic Checklist Completed: patient identified, site marked, surgical consent, pre-op evaluation, timeout performed, IV checked, risks and benefits discussed and monitors and equipment checked Spinal Block Patient position: sitting Prep: DuraPrep Patient monitoring: heart rate Approach: midline Location: L3-4 Injection technique: single-shot Needle Needle type: Pencan  Needle gauge: 24 G Needle length: 10 cm Assessment Sensory level: T4 Additional Notes Spinal kit expiration date checked and verified. Pt tolerated well. Two attempts by CRNA. + CSF, - heme.     

## 2016-05-19 NOTE — Anesthesia Postprocedure Evaluation (Signed)
Anesthesia Post Note  Patient: Dorothy Lawson  Procedure(s) Performed: Procedure(s) (LRB): RIGHT TOTAL KNEE ARTHROPLASTY (Right)  Patient location during evaluation: PACU Anesthesia Type: Spinal Level of consciousness: oriented and awake and alert Pain management: pain level controlled Vital Signs Assessment: post-procedure vital signs reviewed and stable Respiratory status: spontaneous breathing, respiratory function stable and patient connected to nasal cannula oxygen Cardiovascular status: blood pressure returned to baseline and stable Postop Assessment: no headache, no backache and spinal receding Anesthetic complications: no       Last Vitals:  Vitals:   05/19/16 1530 05/19/16 1545  BP: 108/76 138/77  Pulse: 60 (!) 59  Resp: 13 14  Temp:  36.3 C    Last Pain:  Vitals:   05/19/16 1545  TempSrc:   PainSc: 0-No pain                 Effie Berkshire

## 2016-05-19 NOTE — Anesthesia Preprocedure Evaluation (Addendum)
Anesthesia Evaluation  Patient identified by MRN, date of birth, ID band Patient awake    Reviewed: Allergy & Precautions, NPO status , Patient's Chart, lab work & pertinent test results  Airway Mallampati: I  TM Distance: >3 FB Neck ROM: Full    Dental  (+) Teeth Intact, Dental Advisory Given   Pulmonary neg pulmonary ROS,    breath sounds clear to auscultation       Cardiovascular hypertension, Pt. on medications  Rhythm:Regular Rate:Normal     Neuro/Psych negative neurological ROS  negative psych ROS   GI/Hepatic Neg liver ROS, GERD  Medicated,  Endo/Other  negative endocrine ROS  Renal/GU negative Renal ROS  negative genitourinary   Musculoskeletal  (+) Arthritis , Osteoarthritis,    Abdominal   Peds negative pediatric ROS (+)  Hematology negative hematology ROS (+)   Anesthesia Other Findings   Reproductive/Obstetrics                            Lab Results  Component Value Date   WBC 5.7 05/12/2016   HGB 13.3 05/12/2016   HCT 40.8 05/12/2016   MCV 90.7 05/12/2016   PLT 305 05/12/2016   Lab Results  Component Value Date   INR 0.90 05/12/2016   INR 0.93 05/03/2012   EKG: normal sinus rhythm.  Anesthesia Physical Anesthesia Plan  ASA: II  Anesthesia Plan: Spinal   Post-op Pain Management:  Regional for Post-op pain   Induction: Intravenous  Airway Management Planned: Natural Airway  Additional Equipment:   Intra-op Plan:   Post-operative Plan:   Informed Consent: I have reviewed the patients History and Physical, chart, labs and discussed the procedure including the risks, benefits and alternatives for the proposed anesthesia with the patient or authorized representative who has indicated his/her understanding and acceptance.     Plan Discussed with: CRNA  Anesthesia Plan Comments:         Anesthesia Quick Evaluation

## 2016-05-19 NOTE — Progress Notes (Signed)
Assisted Dr. Hollis with right, ultrasound guided, adductor canal block. Side rails up, monitors on throughout procedure. See vital signs in flow sheet. Tolerated Procedure well.  

## 2016-05-20 ENCOUNTER — Encounter (HOSPITAL_COMMUNITY): Payer: Self-pay | Admitting: Orthopedic Surgery

## 2016-05-20 LAB — CBC
HCT: 38.1 % (ref 36.0–46.0)
HEMOGLOBIN: 12.7 g/dL (ref 12.0–15.0)
MCH: 30.8 pg (ref 26.0–34.0)
MCHC: 33.3 g/dL (ref 30.0–36.0)
MCV: 92.5 fL (ref 78.0–100.0)
PLATELETS: 258 10*3/uL (ref 150–400)
RBC: 4.12 MIL/uL (ref 3.87–5.11)
RDW: 12.7 % (ref 11.5–15.5)
WBC: 15.2 10*3/uL — ABNORMAL HIGH (ref 4.0–10.5)

## 2016-05-20 LAB — BASIC METABOLIC PANEL
Anion gap: 7 (ref 5–15)
BUN: 20 mg/dL (ref 6–20)
CALCIUM: 8.8 mg/dL — AB (ref 8.9–10.3)
CO2: 25 mmol/L (ref 22–32)
Chloride: 104 mmol/L (ref 101–111)
Creatinine, Ser: 0.87 mg/dL (ref 0.44–1.00)
GFR calc Af Amer: >60 mL/min (ref >60)
GLUCOSE: 136 mg/dL — AB (ref 65–99)
Potassium: 4.2 mmol/L (ref 3.5–5.1)
Sodium: 136 mmol/L (ref 135–145)

## 2016-05-20 MED ORDER — SODIUM CHLORIDE 0.9 % IV BOLUS (SEPSIS)
250.0000 mL | Freq: Once | INTRAVENOUS | Status: AC
  Administered 2016-05-20: 250 mL via INTRAVENOUS

## 2016-05-20 MED ORDER — TRAMADOL HCL 50 MG PO TABS: 50.0000 mg | ORAL_TABLET | Freq: Four times a day (QID) | ORAL | 0 refills | Status: DC | PRN

## 2016-05-20 MED ORDER — OXYCODONE HCL 5 MG PO TABS: 5.0000 mg | ORAL_TABLET | ORAL | 0 refills | Status: DC | PRN

## 2016-05-20 MED ORDER — METHOCARBAMOL 500 MG PO TABS: 500.0000 mg | ORAL_TABLET | Freq: Four times a day (QID) | ORAL | 0 refills | Status: DC | PRN

## 2016-05-20 MED ORDER — RIVAROXABAN 10 MG PO TABS: 10.0000 mg | ORAL_TABLET | Freq: Every day | ORAL | 0 refills | Status: DC

## 2016-05-20 NOTE — Evaluation (Signed)
Occupational Therapy Evaluation Patient Details Name: Dorothy Lawson MRN: [id number] DOB: [birth year] Today's Date: 05/20/2016    History of Present Illness s/p TKA   Clinical Impression   This 66 year old female was admitted for the above sx. She will benefit from one more session of OT to further educate on bathroom transfers. She has all DME at home    Follow Up Recommendations    24/7 supervision   Equipment Recommendations    none by OT   Recommendations for Other Services       Precautions / Restrictions Precautions Precautions: Fall Required Braces or Orthoses: Knee Immobilizer - Right Knee Immobilizer - Right: Discontinue once straight leg raise with < 10 degree lag Restrictions Weight Bearing Restrictions: No RLE Weight Bearing: Weight bearing as tolerated      Mobility Bed Mobility               General bed mobility comments: oob by PT  Transfers Overall transfer level: Needs assistance Equipment used: Rolling walker (2 wheeled) Transfers: Sit to/from Stand Sit to Stand: Min assist         General transfer comment: assist to rise and steady. Cues for UE/LE placement    Balance                                           ADL either performed or assessed with clinical judgement   ADL Overall ADL's : Needs assistance/impaired Eating/Feeding: Independent   Grooming: Set up;Wash/dry face;Sitting   Upper Body Bathing: Set up;Sitting   Lower Body Bathing: Minimal assistance;Sit to/from stand   Upper Body Dressing : Set up;Sitting   Lower Body Dressing: Moderate assistance;Sit to/from stand                 General ADL Comments: performed ADL from chair, sit to stand. Catheter in place     Vision         Perception     Praxis      Pertinent Vitals/Pain Pain Assessment: 0-10 Pain Score: 6  Pain Location: right knee Pain Descriptors / Indicators: Aching Pain Intervention(s): Limited activity within  patient's tolerance;Monitored during session;Premedicated before session;Repositioned     Hand Dominance     Extremity/Trunk Assessment Upper Extremity Assessment Upper Extremity Assessment: Overall WFL for tasks assessed           Communication Communication Communication: No difficulties   Cognition Arousal/Alertness: Awake/alert Behavior During Therapy: WFL for tasks assessed/performed Overall Cognitive Status: Within Functional Limits for tasks assessed                                     General Comments       Exercises     Shoulder Instructions      Home Living Family/patient expects to be discharged to:: Private residence Living Arrangements: Spouse/significant other Available Help at Discharge: Family;Available 24 hours/day Type of Home: House Home Access: Stairs to enter [employer] of Steps: 3 Entrance Stairs-Rails: Right Home Layout: One level     Bathroom Shower/Tub: Occupational psychologist: Handicapped height     Home Equipment: [place] - single point;Walker - 2 wheels;Bedside commode;Adaptive equipment (safety frame around toilet; 3:1 in shower)          Prior Functioning/Environment Level of Independence:  Independent        Comments: uses cane at night when getting up to bathroom        OT Problem List: Decreased strength;Decreased activity tolerance;Pain      OT Treatment/Interventions: Self-care/ADL training;Patient/family education;DME and/or AE instruction    OT Goals(Current goals can be found in the care plan section) Acute Rehab OT Goals Patient Stated Goal: home and return to independence OT Goal Formulation: With patient Time For Goal Achievement: 05/27/16 Potential to Achieve Goals: Good ADL Goals Pt Will Transfer to Toilet: with supervision;ambulating;bedside commode Pt Will Perform Tub/Shower Transfer: Shower transfer;with min guard assist;ambulating;3 in 1  OT Frequency: Min 2X/week    Barriers to D/C:            Co-evaluation              End of Session    Activity Tolerance: Patient tolerated treatment well Patient left: in chair;with call bell/phone within reach;with chair alarm set  OT Visit Diagnosis: Pain Pain - Right/Left: Right Pain - part of body: Knee                Time: 1027-1050 OT Time Calculation (min): 23 min Charges:  OT General Charges $OT Visit: 1 Procedure OT Evaluation $OT Eval Low Complexity: 1 Procedure G-Codes:     [place], OTR/L [phone] 05/20/2016  Dorothy Lawson 05/20/2016, 10:55 AM

## 2016-05-20 NOTE — Discharge Instructions (Addendum)
° °Dr. Frank Aluisio °Total Joint Specialist °[facility] °[address] °[place], [place] [address] °[phone] ° °TOTAL KNEE REPLACEMENT POSTOPERATIVE DIRECTIONS ° °Knee Rehabilitation, Guidelines Following Surgery  °Results after knee surgery are often greatly improved when you follow the exercise, range of motion and muscle strengthening exercises prescribed by your doctor. Safety measures are also important to protect the knee from further injury. Any time any of these exercises cause you to have increased pain or swelling in your knee joint, decrease the amount until you are comfortable again and slowly increase them. If you have problems or questions, call your caregiver or physical therapist for advice.  ° °HOME CARE INSTRUCTIONS  °Remove items at home which could result in a fall. This includes throw rugs or furniture in walking pathways.  °· ICE to the affected knee every three hours for 30 minutes at a time and then as needed for pain and swelling.  Continue to use ice on the knee for pain and swelling from surgery. You may notice swelling that will progress down to the foot and ankle.  This is normal after surgery.  Elevate the leg when you are not up walking on it.   °· Continue to use the breathing machine which will help keep your temperature down.  It is common for your temperature to cycle up and down following surgery, especially at night when you are not up moving around and exerting yourself.  The breathing machine keeps your lungs expanded and your temperature down. °· Do not place pillow under knee, focus on keeping the knee straight while resting ° °DIET °You may resume your previous home diet once your are discharged from the hospital. ° °DRESSING / WOUND CARE / SHOWERING °You may shower 3 days after surgery, but keep the wounds dry during showering.  You may use an occlusive plastic wrap (Press'n Seal for example), NO SOAKING/SUBMERGING IN THE BATHTUB.  If the  bandage gets wet, change with a clean dry gauze.  If the incision gets wet, pat the wound dry with a clean towel. °You may start showering once you are discharged home but do not submerge the incision under water. Just pat the incision dry and apply a dry gauze dressing on daily. °Change the surgical dressing daily and reapply a dry dressing each time. ° °ACTIVITY °Walk with your walker as instructed. °Use walker as long as suggested by your caregivers. °Avoid periods of inactivity such as sitting longer than an hour when not asleep. This helps prevent blood clots.  °You may resume a sexual relationship in one month or when given the OK by your doctor.  °You may return to work once you are cleared by your doctor.  °Do not drive a car for 6 weeks or until released by you surgeon.  °Do not drive while taking narcotics. ° °WEIGHT BEARING °Weight bearing as tolerated with assist device (walker, cane, etc) as directed, use it as long as suggested by your surgeon or therapist, typically at least 4-6 weeks. ° °POSTOPERATIVE CONSTIPATION PROTOCOL °Constipation - defined medically as fewer than three stools per week and severe constipation as less than one stool per week. ° °One of the most common issues patients have following surgery is constipation.  Even if you have a regular bowel pattern at home, your normal regimen is likely to be disrupted due to multiple reasons following surgery.  Combination of anesthesia, postoperative narcotics, change in appetite and fluid intake all can affect your bowels.    In order to avoid complications following surgery, here are some recommendations in order to help you during your recovery period. ° °Colace (docusate) - Pick up an over-the-counter form of Colace or another stool softener and take twice a day as long as you are requiring postoperative pain medications.  Take with a full glass of water daily.  If you experience loose stools or diarrhea, hold the colace until you stool forms  back up.  If your symptoms do not get better within 1 week or if they get worse, check with your doctor. ° °Dulcolax (bisacodyl) - Pick up over-the-counter and take as directed by the product packaging as needed to assist with the movement of your bowels.  Take with a full glass of water.  Use this product as needed if not relieved by Colace only.  ° °MiraLax (polyethylene glycol) - Pick up over-the-counter to have on hand.  MiraLax is a solution that will increase the amount of water in your bowels to assist with bowel movements.  Take as directed and can mix with a glass of water, juice, soda, coffee, or tea.  Take if you go more than two days without a movement. °Do not use MiraLax more than once per day. Call your doctor if you are still constipated or irregular after using this medication for 7 days in a row. ° °If you continue to have problems with postoperative constipation, please contact the office for further assistance and recommendations.  If you experience "the worst abdominal pain ever" or develop nausea or vomiting, please contact the office immediatly for further recommendations for treatment. ° °ITCHING ° If you experience itching with your medications, try taking only a single pain pill, or even half a pain pill at a time.  You can also use Benadryl over the counter for itching or also to help with sleep.  ° °TED HOSE STOCKINGS °Wear the elastic stockings on both legs for three weeks following surgery during the day but you may remove then at night for sleeping. ° °MEDICATIONS °See your medication summary on the “After Visit Summary” that the nursing staff will review with you prior to discharge.  You may have some home medications which will be placed on hold until you complete the course of blood thinner medication.  It is important for you to complete the blood thinner medication as prescribed by your surgeon.  Continue your approved medications as instructed at time of  discharge. ° °PRECAUTIONS °If you experience chest pain or shortness of breath - call 911 immediately for transfer to the hospital emergency department.  °If you develop a fever greater that 101 F, purulent drainage from wound, increased redness or drainage from wound, foul odor from the wound/dressing, or calf pain - CONTACT YOUR SURGEON.   °                                                °FOLLOW-UP APPOINTMENTS °Make sure you keep all of your appointments after your operation with your surgeon and caregivers. You should call the office at the above phone number and make an appointment for approximately two weeks after the date of your surgery or on the date instructed by your surgeon outlined in the "After Visit Summary". ° ° °RANGE OF MOTION AND STRENGTHENING EXERCISES  °Rehabilitation of the knee is important following a knee injury or   an operation. After just a few days of immobilization, the muscles of the thigh which control the knee become weakened and shrink (atrophy). Knee exercises are designed to build up the tone and strength of the thigh muscles and to improve knee motion. Often times heat used for twenty to thirty minutes before working out will loosen up your tissues and help with improving the range of motion but do not use heat for the first two weeks following surgery. These exercises can be done on a training (exercise) mat, on the floor, on a table or on a bed. Use what ever works the best and is most comfortable for you Knee exercises include:  °Leg Lifts - While your knee is still immobilized in a splint or cast, you can do straight leg raises. Lift the leg to 60 degrees, hold for 3 sec, and slowly lower the leg. Repeat 10-20 times 2-3 times daily. Perform this exercise against resistance later as your knee gets better.  °Quad and Hamstring Sets - Tighten up the muscle on the front of the thigh (Quad) and hold for 5-10 sec. Repeat this 10-20 times hourly. Hamstring sets are done by pushing the  foot backward against an object and holding for 5-10 sec. Repeat as with quad sets.  °· Leg Slides: Lying on your back, slowly slide your foot toward your buttocks, bending your knee up off the floor (only go as far as is comfortable). Then slowly slide your foot back down until your leg is flat on the floor again. °· Angel Wings: Lying on your back spread your legs to the side as far apart as you can without causing discomfort.  °A rehabilitation program following serious knee injuries can speed recovery and prevent re-injury in the future due to weakened muscles. Contact your doctor or a physical therapist for more information on knee rehabilitation.  ° °IF YOU ARE TRANSFERRED TO A SKILLED REHAB FACILITY °If the patient is transferred to a skilled rehab facility following release from the hospital, a list of the current medications will be sent to the facility for the patient to continue.  When discharged from the skilled rehab facility, please have the facility set up the patient's Home Health Physical Therapy prior to being released. Also, the skilled facility will be responsible for providing the patient with their medications at time of release from the facility to include their pain medication, the muscle relaxants, and their blood thinner medication. If the patient is still at the rehab facility at time of the two week follow up appointment, the skilled rehab facility will also need to assist the patient in arranging follow up appointment in our office and any transportation needs. ° °MAKE SURE YOU:  °Understand these instructions.  °Get help right away if you are not doing well or get worse.  ° ° °Pick up stool softner and laxative for home use following surgery while on pain medications. °Do not submerge incision under water. °Please use good hand washing techniques while changing dressing each day. °May shower starting three days after surgery. °Please use a clean towel to pat the incision dry following  showers. °Continue to use ice for pain and swelling after surgery. °Do not use any lotions or creams on the incision until instructed by your surgeon. ° °Take Xarelto for two and a half more weeks following discharge from the hospital, then discontinue Xarelto. °Once the patient has completed the Xarelto, they may resume the 81 mg Aspirin. ° ° ° °Information on   my medicine - XARELTO (Rivaroxaban)  This medication education was reviewed with me or my healthcare representative as part of my discharge preparation.  The pharmacist that spoke with me during my hospital stay was:  Minda Ditto, [facility]  Why was Xarelto prescribed for you? Xarelto was prescribed for you to reduce the risk of blood clots forming after orthopedic surgery. The medical term for these abnormal blood clots is venous thromboembolism (VTE).  What do you need to know about xarelto ? Take your Xarelto ONCE DAILY at the same time every day. You may take it either with or without food.  If you have difficulty swallowing the tablet whole, you may crush it and mix in applesauce just prior to taking your dose.  Take Xarelto exactly as prescribed by your doctor and DO NOT stop taking Xarelto without talking to the doctor who prescribed the medication.  Stopping without other VTE prevention medication to take the place of Xarelto may increase your risk of developing a clot.  After discharge, you should have regular check-up appointments with your healthcare provider that is prescribing your Xarelto.    What do you do if you miss a dose? If you miss a dose, take it as soon as you remember on the same day then continue your regularly scheduled once daily regimen the next day. Do not take two doses of Xarelto on the same day.   Important Safety Information A possible side effect of Xarelto is bleeding. You should call your healthcare provider right away if you experience any of the following: ? Bleeding from an injury or your  nose that does not stop. ? Unusual colored urine (red or dark brown) or unusual colored stools (red or black). ? Unusual bruising for unknown reasons. ? A serious fall or if you hit your head (even if there is no bleeding).  Some medicines may interact with Xarelto and might increase your risk of bleeding while on Xarelto. To help avoid this, consult your healthcare provider or pharmacist prior to using any new prescription or non-prescription medications, including herbals, vitamins, non-steroidal anti-inflammatory drugs (NSAIDs) and supplements.  Plan to hold Aspirin, Naproxen while on Xarelto  This website has more information on Xarelto: [email or web address]

## 2016-05-20 NOTE — Progress Notes (Signed)
Disposition plan is for home with Husband, she is setup outpatient at [facility] in [place], [place]. [phone]

## 2016-05-20 NOTE — Progress Notes (Signed)
Physical Therapy Treatment Patient Details Name: Dorothy Lawson MRN: [id number] DOB: [birth year] Today's Date: 05/20/2016    History of Present Illness s/p R TKA; PMHx: L TKA    PT Comments    Pt progressing; concerned about the possibility of going home tomorrow   Follow Up Recommendations  Outpatient PT     Equipment Recommendations  None recommended by PT    Recommendations for Other Services       Precautions / Restrictions Precautions Precautions: Fall Precaution Comments: KI not used,  IND SLRs Knee Immobilizer - Right: Discontinue once straight leg raise with < 10 degree lag Restrictions Weight Bearing Restrictions: No Other Position/Activity Restrictions: WBAT    Mobility  Bed Mobility Overal bed mobility: Needs Assistance Bed Mobility: Sit to Supine;Supine to Sit     Supine to sit: Min assist Sit to supine: Min assist   General bed mobility comments: assist with RLE  Transfers Overall transfer level: Needs assistance Equipment used: Rolling walker (2 wheeled) Transfers: Sit to/from Stand Sit to Stand: Min guard         General transfer comment: assist to rise and steady. Cues for UE/LE placement  Ambulation/Gait Ambulation/Gait assistance: Min assist Ambulation Distance (Feet): 30 Feet (10' more) Assistive device: Rolling walker (2 wheeled) Gait Pattern/deviations: Step-to pattern;Decreased weight shift to right     General Gait Details: incr time, multi-modal cues for sequence, RW position   Stairs            Wheelchair Mobility    Modified Rankin (Stroke Patients Only)       Balance                                            Cognition Arousal/Alertness: Awake/alert Behavior During Therapy: WFL for tasks assessed/performed Overall Cognitive Status: Within Functional Limits for tasks assessed                                        Exercises Total Joint Exercises Ankle Circles/Pumps:  AROM;Left;10 reps Quad Sets: AROM;Both;10 reps;Strengthening Heel Slides: AAROM;Right;10 reps Hip ABduction/ADduction: AROM;AAROM;Strengthening;Right;10 reps Straight Leg Raises: AAROM;AROM;Strengthening;Right;10 reps    General Comments        Pertinent Vitals/Pain Pain Assessment: 0-10 Pain Score: 4  Pain Location: right knee Pain Descriptors / Indicators: Aching Pain Intervention(s): Limited activity within patient's tolerance;Monitored during session;Premedicated before session;Repositioned    Home Living                      Prior Function            PT Goals (current goals can now be found in the care plan section) Acute Rehab PT Goals Patient Stated Goal: home and return to independence PT Goal Formulation: With patient Time For Goal Achievement: 05/27/16 Potential to Achieve Goals: Good Progress towards PT goals: Progressing toward goals    Frequency    7X/week      PT Plan Current plan remains appropriate    Co-evaluation             End of Session Equipment Utilized During Treatment: Gait belt Activity Tolerance: Patient tolerated treatment well Patient left: in bed;with bed alarm set;with call bell/phone within reach   PT Visit Diagnosis: Difficulty in walking, not elsewhere classified (R26.2)  Time: 9379-0240 PT Time Calculation (min) (ACUTE ONLY): 32 min  Charges:  $Gait Training: 8-22 mins $Therapeutic Exercise: 8-22 mins                    G Codes:          Valkyrie Guardiola 2016-06-17, 2:58 PM

## 2016-05-20 NOTE — Evaluation (Signed)
Physical Therapy Evaluation Patient Details Name: Dorothy Lawson MRN: [id number] DOB: 1950/12/16 Today's Date: 05/20/2016   History of Present Illness  s/p R TKA; PMHx: L TKA  Clinical Impression  Pt is s/p TKA resulting in the deficits listed below (see PT Problem List). * Pt will benefit from skilled PT to increase their independence and safety with mobility to allow discharge to the venue listed below.      Follow Up Recommendations Outpatient PT    Equipment Recommendations  None recommended by PT    Recommendations for Other Services       Precautions / Restrictions Precautions Precautions: Fall Precaution Comments: KI not used,  IND SLRs Required Braces or Orthoses: Knee Immobilizer - Right Knee Immobilizer - Right: Discontinue once straight leg raise with < 10 degree lag Restrictions Weight Bearing Restrictions: No RLE Weight Bearing: Weight bearing as tolerated      Mobility  Bed Mobility Overal bed mobility: Needs Assistance Bed Mobility: Supine to Sit     Supine to sit: Min assist     General bed mobility comments: assist with RLE   Transfers Overall transfer level: Needs assistance Equipment used: Rolling walker (2 wheeled) Transfers: Sit to/from Stand Sit to Stand: Min assist         General transfer comment: assist to rise and steady. Cues for UE/LE placement  Ambulation/Gait Ambulation/Gait assistance: Min assist Ambulation Distance (Feet): 10 Feet Assistive device: Rolling walker (2 wheeled) Gait Pattern/deviations: Step-to pattern;Decreased weight shift to right     General Gait Details: incr time, multi-modal cues for sequence, RW position  Stairs            Wheelchair Mobility    Modified Rankin (Stroke Patients Only)       Balance                                             Pertinent Vitals/Pain Pain Assessment: 0-10 Pain Score: 2  Pain Location: right knee Pain Descriptors / Indicators:  Aching Pain Intervention(s): Limited activity within patient's tolerance;Monitored during session;Premedicated before session;Repositioned    Home Living Family/patient expects to be discharged to:: Private residence Living Arrangements: Spouse/significant other Available Help at Discharge: Family;Available 24 hours/day Type of Home: House Home Access: Stairs to enter Entrance Stairs-Rails: Right Entrance Stairs-Number of Steps: 3 Home Layout: One level Home Equipment: Cane - single point;Walker - 2 wheels;Bedside commode;Adaptive equipment (safety frame around toilet; 3:1 in shower)      Prior Function Level of Independence: Independent         Comments: uses cane at night when getting up to bathroom     Hand Dominance        Extremity/Trunk Assessment   Upper Extremity Assessment Upper Extremity Assessment: Overall WFL for tasks assessed    Lower Extremity Assessment Lower Extremity Assessment: RLE deficits/detail RLE Deficits / Details: ankle WFL, knee extension and hip flexion 3/5       Communication   Communication: No difficulties  Cognition Arousal/Alertness: Awake/alert Behavior During Therapy: WFL for tasks assessed/performed Overall Cognitive Status: Within Functional Limits for tasks assessed                                        General Comments      Exercises  Assessment/Plan    PT Assessment Patient needs continued PT services  PT Problem List Decreased strength;Decreased range of motion;Decreased activity tolerance;Decreased balance;Decreased knowledge of use of DME;Decreased mobility;Pain;Decreased knowledge of precautions       PT Treatment Interventions DME instruction;Gait training;Functional mobility training;Therapeutic activities;Therapeutic exercise;Patient/family education    PT Goals (Current goals can be found in the Care Plan section)  Acute Rehab PT Goals Patient Stated Goal: home and return to  independence PT Goal Formulation: With patient Time For Goal Achievement: 05/27/16 Potential to Achieve Goals: Good    Frequency 7X/week   Barriers to discharge        Co-evaluation               End of Session Equipment Utilized During Treatment: Gait belt Activity Tolerance: Patient tolerated treatment well Patient left: with call bell/phone within reach;in chair;with chair alarm set   PT Visit Diagnosis: Difficulty in walking, not elsewhere classified (R26.2)    Time: 2482-5003 PT Time Calculation (min) (ACUTE ONLY): 33 min   Charges:   PT Evaluation $PT Eval Low Complexity: 1 Procedure PT Treatments $Gait Training: 8-22 mins   PT G CodesKenyon Ana, PT Pager: [phone] 05/20/2016   [facility] 05/20/2016, 12:49 PM

## 2016-05-20 NOTE — Discharge Summary (Signed)
Physician Discharge Summary   Patient ID: Dorothy Lawson MRN: [id number] DOB/AGE: 06-Feb-1950 66 y.o.  Admit date: 05/19/2016 Discharge date: 05/21/2016  Primary Diagnosis:  Osteoarthritis  Right knee(s) Admission Diagnoses:  Past Medical History:  Diagnosis Date  . Arthritis 05-03-12   osteoarthritis - knees  . GERD (gastroesophageal reflux disease)    tx. Prilosec  . Hypercholesterolemia 04-15-12   tx. meds  . Hypertension    Discharge Diagnoses:   Active Problems:   OA (osteoarthritis) of knee  Estimated body mass index is 27.12 kg/m as calculated from the following:   Height as of this encounter: '5\' 6"'$  (1.676 m).   Weight as of this encounter: 76.2 kg (168 lb).  Procedure:  Procedure(s) (LRB): RIGHT TOTAL KNEE ARTHROPLASTY (Right)   Consults: None  HPI: Dorothy Lawson is a 66 y.o. year old female with end stage OA of her right knee with progressively worsening pain and dysfunction. She has constant pain, with activity and at rest and significant functional deficits with difficulties even with ADLs. She has had extensive non-op management including analgesics, injections of cortisone and viscosupplements, and home exercise program, but remains in significant pain with significant dysfunction.Radiographs show bone on bone arthritis medial and patellofemoral. She presents now for right Total Knee Arthroplasty.  Laboratory Data: Admission on 05/19/2016  Component Date Value Ref Range Status  . WBC 05/20/2016 15.2* 4.0 - 10.5 K/uL Final  . RBC 05/20/2016 4.12  3.87 - 5.11 MIL/uL Final  . Hemoglobin 05/20/2016 12.7  12.0 - 15.0 g/dL Final  . HCT 05/20/2016 38.1  36.0 - 46.0 % Final  . MCV 05/20/2016 92.5  78.0 - 100.0 fL Final  . MCH 05/20/2016 30.8  26.0 - 34.0 pg Final  . MCHC 05/20/2016 33.3  30.0 - 36.0 g/dL Final  . RDW 05/20/2016 12.7  11.5 - 15.5 % Final  . Platelets 05/20/2016 258  150 - 400 K/uL Final  . Sodium 05/20/2016 136  135 - 145 mmol/L Final  .  Potassium 05/20/2016 4.2  3.5 - 5.1 mmol/L Final  . Chloride 05/20/2016 104  101 - 111 mmol/L Final  . CO2 05/20/2016 25  22 - 32 mmol/L Final  . Glucose, Bld 05/20/2016 136* 65 - 99 mg/dL Final  . BUN 05/20/2016 20  6 - 20 mg/dL Final  . Creatinine, Ser 05/20/2016 0.87  0.44 - 1.00 mg/dL Final  . Calcium 05/20/2016 8.8* 8.9 - 10.3 mg/dL Final  . GFR calc non Af Amer 05/20/2016 >60  >60 mL/min Final  . GFR calc Af Amer 05/20/2016 >60  >60 mL/min Final   Comment: (NOTE) The eGFR has been calculated using the CKD EPI equation. This calculation has not been validated in all clinical situations. eGFR's persistently <60 mL/min signify possible Chronic Kidney Disease.   Georgiann Hahn gap 05/20/2016 7  5 - 15 Final  Hospital Outpatient Visit on 05/12/2016  Component Date Value Ref Range Status  . aPTT 05/12/2016 28  24 - 36 seconds Final  . WBC 05/12/2016 5.7  4.0 - 10.5 K/uL Final  . RBC 05/12/2016 4.50  3.87 - 5.11 MIL/uL Final  . Hemoglobin 05/12/2016 13.3  12.0 - 15.0 g/dL Final  . HCT 05/12/2016 40.8  36.0 - 46.0 % Final  . MCV 05/12/2016 90.7  78.0 - 100.0 fL Final  . MCH 05/12/2016 29.6  26.0 - 34.0 pg Final  . MCHC 05/12/2016 32.6  30.0 - 36.0 g/dL Final  . RDW 05/12/2016 12.8  11.5 - 15.5 %  Final  . Platelets 05/12/2016 305  150 - 400 K/uL Final  . Sodium 05/12/2016 139  135 - 145 mmol/L Final  . Potassium 05/12/2016 4.3  3.5 - 5.1 mmol/L Final  . Chloride 05/12/2016 103  101 - 111 mmol/L Final  . CO2 05/12/2016 30  22 - 32 mmol/L Final  . Glucose, Bld 05/12/2016 96  65 - 99 mg/dL Final  . BUN 05/12/2016 22* 6 - 20 mg/dL Final  . Creatinine, Ser 05/12/2016 0.95  0.44 - 1.00 mg/dL Final  . Calcium 05/12/2016 9.9  8.9 - 10.3 mg/dL Final  . Total Protein 05/12/2016 7.3  6.5 - 8.1 g/dL Final  . Albumin 05/12/2016 4.2  3.5 - 5.0 g/dL Final  . AST 05/12/2016 24  15 - 41 U/L Final  . ALT 05/12/2016 20  14 - 54 U/L Final  . Alkaline Phosphatase 05/12/2016 66  38 - 126 U/L Final  . Total  Bilirubin 05/12/2016 1.0  0.3 - 1.2 mg/dL Final  . GFR calc non Af Amer 05/12/2016 >60  >60 mL/min Final  . GFR calc Af Amer 05/12/2016 >60  >60 mL/min Final   Comment: (NOTE) The eGFR has been calculated using the CKD EPI equation. This calculation has not been validated in all clinical situations. eGFR's persistently <60 mL/min signify possible Chronic Kidney Disease.   . Anion gap 05/12/2016 6  5 - 15 Final  . Prothrombin Time 05/12/2016 12.1  11.4 - 15.2 seconds Final  . INR 05/12/2016 0.90   Final  . ABO/RH(D) 05/12/2016 O POS   Final  . Antibody Screen 05/12/2016 NEG   Final  . Sample Expiration 05/12/2016 05/22/2016   Final  . Extend sample reason 05/12/2016 NO TRANSFUSIONS OR PREGNANCY IN THE PAST 3 MONTHS   Final  . MRSA, PCR 05/12/2016 NEGATIVE  NEGATIVE Final  . Staphylococcus aureus 05/12/2016 NEGATIVE  NEGATIVE Final   Comment:        The Xpert SA Assay (FDA approved for NASAL specimens in patients over 45 years of age), is one component of a comprehensive surveillance program.  Test performance has been validated by [facility] for patients greater than or equal to 52 year old. It is not intended to diagnose infection nor to guide or monitor treatment.      X-Rays:No results found.  EKG: Orders placed or performed during the hospital encounter of 05/12/16  . EKG 12 lead  . EKG 12 lead     Hospital Course: [name] is a 66 y.o. who was admitted to [facility]. They were brought to the operating room on 05/19/2016 and underwent Procedure(s): RIGHT TOTAL KNEE ARTHROPLASTY.  Patient tolerated the procedure well and was later transferred to the recovery room and then to the orthopaedic floor for postoperative care.  They were given PO and IV analgesics for pain control following their surgery.  They were given 24 hours of postoperative antibiotics of  Anti-infectives    Start     Dose/Rate Route Frequency Ordered Stop   05/19/16 1800  ceFAZolin  (ANCEF) IVPB 2g/100 mL premix     2 g 200 mL/hr over 30 Minutes Intravenous Every 6 hours 05/19/16 1613 05/20/16 0033   05/19/16 0944  ceFAZolin (ANCEF) 2-4 GM/100ML-% IVPB    Comments:  Harvell, Gwendolyn  : cabinet override      05/19/16 0944 05/19/16 1128   05/19/16 0937  ceFAZolin (ANCEF) IVPB 2g/100 mL premix     2 g 200 mL/hr over 30  Minutes Intravenous On call to O.R. 05/19/16 [id number] 05/19/16 1128     and started on DVT prophylaxis in the form of Xarelto.   PT and OT were ordered for total joint protocol.  Discharge planning consulted to help with postop disposition and equipment needs.  Patient had a decent night on the evening of surgery.  They started to get up OOB with therapy on day one. Hemovac drain was pulled without difficulty.  Continued to work with therapy into day two.  Dressing was changed on day two and the incision was healing well.  Patient was seen in rounds on day two and was ready to go home.   Diet: Cardiac diet Activity:WBAT Follow-up:in 2 weeks Disposition - Home Discharged Condition: good   Discharge Instructions    Call MD / Call 911    Complete by:  As directed    If you experience chest pain or shortness of breath, CALL 911 and be transported to the hospital emergency room.  If you develope a fever above 101 F, pus (white drainage) or increased drainage or redness at the wound, or calf pain, call your surgeon's office.   Change dressing    Complete by:  As directed    Change dressing daily with sterile 4 x 4 inch gauze dressing and apply TED hose. Do not submerge the incision under water.   Constipation Prevention    Complete by:  As directed    Drink plenty of fluids.  Prune juice may be helpful.  You may use a stool softener, such as Colace (over the counter) 100 mg twice a day.  Use MiraLax (over the counter) for constipation as needed.   Diet - low sodium heart healthy    Complete by:  As directed    Discharge instructions    Complete by:  As  directed    Pick up stool softner and laxative for home use following surgery while on pain medications. Do not submerge incision under water. Please use good hand washing techniques while changing dressing each day. May shower starting three days after surgery. Please use a clean towel to pat the incision dry following showers. Continue to use ice for pain and swelling after surgery. Do not use any lotions or creams on the incision until instructed by your surgeon.  Wear both TED hose on both legs during the day every day for three weeks, but may have off at night at home.  Postoperative Constipation Protocol  Constipation - defined medically as fewer than three stools per week and severe constipation as less than one stool per week.  One of the most common issues patients have following surgery is constipation.  Even if you have a regular bowel pattern at home, your normal regimen is likely to be disrupted due to multiple reasons following surgery.  Combination of anesthesia, postoperative narcotics, change in appetite and fluid intake all can affect your bowels.  In order to avoid complications following surgery, here are some recommendations in order to help you during your recovery period.  Colace (docusate) - Pick up an over-the-counter form of Colace or another stool softener and take twice a day as long as you are requiring postoperative pain medications.  Take with a full glass of water daily.  If you experience loose stools or diarrhea, hold the colace until you stool forms back up.  If your symptoms do not get better within 1 week or if they get worse, check with your doctor.  Dulcolax (bisacodyl) -  Pick up over-the-counter and take as directed by the product packaging as needed to assist with the movement of your bowels.  Take with a full glass of water.  Use this product as needed if not relieved by Colace only.   MiraLax (polyethylene glycol) - Pick up over-the-counter to have on  hand.  MiraLax is a solution that will increase the amount of water in your bowels to assist with bowel movements.  Take as directed and can mix with a glass of water, juice, soda, coffee, or tea.  Take if you go more than two days without a movement. Do not use MiraLax more than once per day. Call your doctor if you are still constipated or irregular after using this medication for 7 days in a row.  If you continue to have problems with postoperative constipation, please contact the office for further assistance and recommendations.  If you experience "the worst abdominal pain ever" or develop nausea or vomiting, please contact the office immediatly for further recommendations for treatment.   Take Xarelto for two and a half more weeks, then discontinue Xarelto. Once the patient has completed the Xarelto, they may resume the 81 mg Aspirin.   Do not put a pillow under the knee. Place it under the heel.    Complete by:  As directed    Do not sit on low chairs, stoools or toilet seats, as it may be difficult to get up from low surfaces    Complete by:  As directed    Driving restrictions    Complete by:  As directed    No driving until released by the physician.   Increase activity slowly as tolerated    Complete by:  As directed    Lifting restrictions    Complete by:  As directed    No lifting until released by the physician.   Patient may shower    Complete by:  As directed    You may shower without a dressing once there is no drainage.  Do not wash over the wound.  If drainage remains, do not shower until drainage stops.   TED hose    Complete by:  As directed    Use stockings (TED hose) for 3 weeks on both leg(s).  You may remove them at night for sleeping.   Weight bearing as tolerated    Complete by:  As directed    Laterality:  right   Extremity:  Lower     Allergies as of 05/20/2016      Reactions   Macrodantin [nitrofurantoin]    Unsure reaction   Keflex [cephalexin] Rash        Medication List    STOP taking these medications   aspirin EC 81 MG tablet   CALCIUM 600 + D PO   GLUCOSAMINE CHONDR 1500 COMPLX PO   multivitamin with minerals Tabs tablet   naproxen sodium 220 MG tablet Commonly known as:  ANAPROX   Vitamin D3 400 units Caps     TAKE these medications   acetaminophen 500 MG tablet Commonly known as:  TYLENOL Take 1,000 mg by mouth every 6 (six) hours as needed (for pain.).   atorvastatin 10 MG tablet Commonly known as:  LIPITOR TAKE ONE TABLET BY MOUTH AT BEDTIME FOR CHOLESTEROL.   famotidine 20 MG tablet Commonly known as:  PEPCID Take 20 mg by mouth at bedtime.   Magnesium 250 MG Tabs Take 250 mg by mouth at bedtime.   methocarbamol 500  MG tablet Commonly known as:  ROBAXIN Take 1 tablet (500 mg total) by mouth every 6 (six) hours as needed for muscle spasms.   metoprolol 50 MG tablet Commonly known as:  LOPRESSOR Take 50 mg by mouth 2 (two) times daily.   oxyCODONE 5 MG immediate release tablet Commonly known as:  Oxy IR/ROXICODONE Take 1-2 tablets (5-10 mg total) by mouth every 4 (four) hours as needed for moderate pain or severe pain.   psyllium 58.6 % packet Commonly known as:  METAMUCIL Take 1 packet by mouth at bedtime.   rivaroxaban 10 MG Tabs tablet Commonly known as:  XARELTO Take 1 tablet (10 mg total) by mouth daily with breakfast. Take Xarelto for two and a half more weeks following discharge from the hospital, then discontinue Xarelto. Once the patient has completed the Xarelto, they may resume the 81 mg Aspirin. Start taking on:  05/21/2016   traMADol 50 MG tablet Commonly known as:  ULTRAM Take 1-2 tablets (50-100 mg total) by mouth every 6 (six) hours as needed for moderate pain.   triamterene-hydrochlorothiazide 37.5-25 MG tablet Commonly known as:  MAXZIDE-25 Take 0.5 tablets by mouth daily before breakfast.      Follow-up Information    [name], MD. Schedule an appointment as soon  as possible for a visit on 06/03/2016.   Specialty:  Orthopedic Surgery Contact information: [address] [place] [address] [phone]           Signed: Arlee Muslim, PA-C Orthopaedic Surgery 05/20/2016, 9:33 PM

## 2016-05-20 NOTE — Progress Notes (Signed)
   Subjective: 1 Day Post-Op Procedure(s) (LRB): RIGHT TOTAL KNEE ARTHROPLASTY (Right) Patient reports pain as mild.   Patient seen in rounds for Dr. Wynelle Link. Patient is well, but has had some minor complaints of pain in the knee\, requiring pain medications Started therapy earlier today.  Planned to go straight to outpatient therapy. Plan is to go Home after hospital stay.  Objective: Vital signs in last 24 hours: Temp:  [97.5 F (36.4 C)-97.7 F (36.5 C)] 97.5 F (36.4 C) (04/17 1510) Pulse Rate:  [53-70] 70 (04/17 1510) Resp:  [10-16] 14 (04/17 1510) BP: (92-110)/(53-62) 110/54 (04/17 1510) SpO2:  [95 %-99 %] 95 % (04/17 1510)  Intake/Output from previous day:  Intake/Output Summary (Last 24 hours) at 05/20/16 2128 Last data filed at 05/20/16 1700  Gross per 24 hour  Intake          2636.25 ml  Output             2765 ml  Net          -128.75 ml    Intake/Output this shift: No intake/output data recorded.  Labs:  Recent Labs  05/20/16 0530  HGB 12.7    Recent Labs  05/20/16 0530  WBC 15.2*  RBC 4.12  HCT 38.1  PLT 258    Recent Labs  05/20/16 0530  NA 136  K 4.2  CL 104  CO2 25  BUN 20  CREATININE 0.87  GLUCOSE 136*  CALCIUM 8.8*   No results for input(s): LABPT, INR in the last 72 hours.  EXAM General - Patient is Alert, Appropriate and Oriented Extremity - Neurovascular intact Sensation intact distally Intact pulses distally Dorsiflexion/Plantar flexion intact Dressing - dressing C/D/I Motor Function - intact, moving foot and toes well on exam.  Hemovac pulled without difficulty.  Past Medical History:  Diagnosis Date  . Arthritis 05-03-12   osteoarthritis - knees  . GERD (gastroesophageal reflux disease)    tx. Prilosec  . Hypercholesterolemia 04-15-12   tx. meds  . Hypertension     Assessment/Plan: 1 Day Post-Op Procedure(s) (LRB): RIGHT TOTAL KNEE ARTHROPLASTY (Right) Active Problems:   OA (osteoarthritis) of  knee  Estimated body mass index is 27.12 kg/m as calculated from the following:   Height as of this encounter: 5\' 6"  (1.676 m).   Weight as of this encounter: 76.2 kg (168 lb). Advance diet Up with therapy Plan for discharge tomorrow then straight to outpatient therapy at [facility] in [place], [place]  DVT Prophylaxis - Xarelto Weight-Bearing as tolerated to right leg D/C O2 and Pulse OX and try on Room Air HGB 12.7  Arlee Muslim, PA-C Orthopaedic Surgery 05/20/2016, 9:28 PM

## 2016-05-21 LAB — CBC
HCT: 35.2 % — ABNORMAL LOW (ref 36.0–46.0)
Hemoglobin: 11.7 g/dL — ABNORMAL LOW (ref 12.0–15.0)
MCH: 30.7 pg (ref 26.0–34.0)
MCHC: 33.2 g/dL (ref 30.0–36.0)
MCV: 92.4 fL (ref 78.0–100.0)
PLATELETS: 269 10*3/uL (ref 150–400)
RBC: 3.81 MIL/uL — AB (ref 3.87–5.11)
RDW: 12.9 % (ref 11.5–15.5)
WBC: 14 10*3/uL — AB (ref 4.0–10.5)

## 2016-05-21 LAB — BASIC METABOLIC PANEL
Anion gap: 5 (ref 5–15)
BUN: 22 mg/dL — ABNORMAL HIGH (ref 6–20)
CO2: 31 mmol/L (ref 22–32)
Calcium: 9.1 mg/dL (ref 8.9–10.3)
Chloride: 102 mmol/L (ref 101–111)
Creatinine, Ser: 0.87 mg/dL (ref 0.44–1.00)
GFR calc Af Amer: >60 mL/min (ref >60)
GLUCOSE: 106 mg/dL — AB (ref 65–99)
POTASSIUM: 4.2 mmol/L (ref 3.5–5.1)
SODIUM: 138 mmol/L (ref 135–145)

## 2016-05-21 NOTE — Progress Notes (Signed)
Occupational Therapy Treatment Patient Details Name: Dorothy Lawson MRN: [id number] DOB: 1950/02/13 Today's Date: 05/21/2016    History of present illness s/p R TKA; PMHx: L TKA   OT comments  Reviewed and practiced bathroom transfers  Follow Up Recommendations  No OT follow up;Supervision/Assistance - 24 hour    Equipment Recommendations  None recommended by OT    Recommendations for Other Services      Precautions / Restrictions Precautions Precautions: Fall Precaution Comments: KI used; couldn't do SLR this am Required Braces or Orthoses: Knee Immobilizer - Right Restrictions Weight Bearing Restrictions: No RLE Weight Bearing: Weight bearing as tolerated       Mobility Bed Mobility         Supine to sit: Min assist     General bed mobility comments: assist with RLE  Transfers   Equipment used: Rolling walker (2 wheeled)   Sit to Stand: Min guard         General transfer comment: for safety; cues for UE/LE placement    Balance                                           ADL either performed or assessed with clinical judgement   ADL       Grooming: Wash/dry hands;Supervision/safety;Standing                   Toilet Transfer: Min guard;Ambulation;BSC;RW   Toileting- Water quality scientist and Hygiene: Min guard;Sit to/from stand   Tub/ Shower Transfer: Walk-in shower;Min guard;Ambulation;3 in 1     General ADL Comments: practiced bathroom transfers.  Pt needs cues for sequence. Gave her handouts, but this may have been more confusing for her     Vision       Perception     Praxis      Cognition Arousal/Alertness: Awake/alert Behavior During Therapy: WFL for tasks assessed/performed Overall Cognitive Status: Within Functional Limits for tasks assessed                                          Exercises     Shoulder Instructions       General Comments      Pertinent Vitals/ Pain        Pain Score: 5  Pain Location: R knee  Pain Descriptors / Indicators: Aching Pain Intervention(s): Limited activity within patient's tolerance;Monitored during session;Premedicated before session;Repositioned  Home Living                                          Prior Functioning/Environment              Frequency           Progress Toward Goals  OT Goals(current goals can now be found in the care plan section)  Progress towards OT goals: Progressing toward goals     Plan      Co-evaluation                 End of Session CPM Right Knee CPM Right Knee: Off  OT Visit Diagnosis: Pain Pain - Right/Left: Right Pain - part of body: Knee   Activity Tolerance Patient  tolerated treatment well   Patient Left in chair;with call bell/phone within reach;with chair alarm set   Nurse Communication          Time: [phone] OT Time Calculation (min): 44 min  Charges: OT General Charges $OT Visit: 1 Procedure OT Treatments $Self Care/Home Management : 23-37 mins  Lesle Chris, OTR/L [phone] 05/21/2016   Dorothy Lawson 05/21/2016, 10:00 AM

## 2016-05-21 NOTE — Progress Notes (Signed)
Physical Therapy Treatment Patient Details Name: Dorothy Lawson MRN: [id number] DOB: 01/22/1951 Today's Date: 05/21/2016    History of Present Illness s/p R TKA; PMHx: L TKA    PT Comments    Pt progressing, continues to be anxious regarding mobility and D/C; will see for second session  Follow Up Recommendations  Outpatient PT     Equipment Recommendations  None recommended by PT    Recommendations for Other Services       Precautions / Restrictions Precautions Precautions: Fall;Knee Precaution Comments: KI used; couldn't do SLR this am Required Braces or Orthoses: Knee Immobilizer - Right Knee Immobilizer - Right: Discontinue once straight leg raise with < 10 degree lag Restrictions Weight Bearing Restrictions: No RLE Weight Bearing: Weight bearing as tolerated Other Position/Activity Restrictions: WBAT    Mobility  Bed Mobility Overal bed mobility: Needs Assistance Bed Mobility: Sit to Supine     Supine to sit: Min assist Sit to supine: Min assist   General bed mobility comments: assist with RLE and multi-modal cues for technique and self assist  Transfers Overall transfer level: Needs assistance Equipment used: Rolling walker (2 wheeled)   Sit to Stand: Min guard         General transfer comment: for safety; cues for UE/LE placement  Ambulation/Gait Ambulation/Gait assistance: Min guard Ambulation Distance (Feet): 40 Feet Assistive device: Rolling walker (2 wheeled) Gait Pattern/deviations: Step-to pattern;Decreased weight shift to right     General Gait Details: incr time, multi-modal cues for sequence, RW position   Stairs Stairs: Yes   Stair Management: One rail Right;Forwards;With walker Number of Stairs: 3 General stair comments: cues for sequence and technique  Wheelchair Mobility    Modified Rankin (Stroke Patients Only)       Balance                                            Cognition  Arousal/Alertness: Awake/alert Behavior During Therapy: Anxious Overall Cognitive Status: Within Functional Limits for tasks assessed                                        Exercises      General Comments        Pertinent Vitals/Pain Pain Assessment: No/denies pain Pain Score: 6  Pain Location: R knee  Pain Descriptors / Indicators: Aching Pain Intervention(s): Limited activity within patient's tolerance;Monitored during session;Premedicated before session;Repositioned    Home Living                      Prior Function            PT Goals (current goals can now be found in the care plan section) Acute Rehab PT Goals PT Goal Formulation: With patient Time For Goal Achievement: 05/27/16 Potential to Achieve Goals: Good Progress towards PT goals: Progressing toward goals    Frequency    7X/week      PT Plan Current plan remains appropriate    Co-evaluation             End of Session Equipment Utilized During Treatment: Gait belt Activity Tolerance: Patient tolerated treatment well Patient left: in bed;with bed alarm set;with call bell/phone within reach   PT Visit Diagnosis: Difficulty in walking, not elsewhere classified (R26.2)  Time: 5631-4970 PT Time Calculation (min) (ACUTE ONLY): 20 min  Charges:  $Gait Training: 8-22 mins                    G CodesKenyon Ana, PT Pager: [phone] 05/21/2016    [facility] 05/21/2016, 10:48 AM

## 2016-05-21 NOTE — Progress Notes (Signed)
Physical Therapy Treatment Patient Details Name: Dorothy Lawson MRN: [id number] DOB: [birth year] Today's Date: 05/21/2016    History of Present Illness s/p R TKA; PMHx: L TKA    PT Comments    Pt progressing well; many questions, all areas addressed/answered  Follow Up Recommendations  Outpatient PT     Equipment Recommendations  None recommended by PT    Recommendations for Other Services       Precautions / Restrictions Precautions Precautions: Fall;Knee Precaution Comments: KI used; couldn't do SLR this am Required Braces or Orthoses: Knee Immobilizer - Right Knee Immobilizer - Right: Discontinue once straight leg raise with < 10 degree lag Restrictions Weight Bearing Restrictions: No Other Position/Activity Restrictions: WBAT    Mobility  Bed Mobility Overal bed mobility: Needs Assistance Bed Mobility: Supine to Sit     Supine to sit: Min guard     General bed mobility comments: cues to self assist RLE off bed  Transfers Overall transfer level: Needs assistance Equipment used: Rolling walker (2 wheeled) Transfers: Sit to/from Stand Sit to Stand: Min guard         General transfer comment: repeated x3 for instruction, safety, to donn LB garments  Ambulation/Gait Ambulation/Gait assistance: Min guard Ambulation Distance (Feet): 100 Feet Assistive device: Rolling walker (2 wheeled) Gait Pattern/deviations: Step-to pattern;Decreased weight shift to right     General Gait Details: cues for sequence   Stairs Stairs: Yes   Stair Management: One rail Right;Forwards;With walker Number of Stairs: 3 (x3) General stair comments: cues for sequence and technique  Wheelchair Mobility    Modified Rankin (Stroke Patients Only)       Balance                                            Cognition Arousal/Alertness: Awake/alert Behavior During Therapy: Anxious Overall Cognitive Status: Within Functional Limits for tasks assessed                                        Exercises Total Joint Exercises Straight Leg Raises:  (gave HEP to do at home)    General Comments General comments (skin integrity, edema, etc.): reviewed shower tranfser with pt and husband(multiple questions)      Pertinent Vitals/Pain Pain Assessment: 0-10 Pain Score: 8  Pain Location: R knee  Pain Descriptors / Indicators: Aching;Sore Pain Intervention(s): Monitored during session;Limited activity within patient's tolerance;Premedicated before session    Home Living                      Prior Function            PT Goals (current goals can now be found in the care plan section) Acute Rehab PT Goals Patient Stated Goal: home and return to independence PT Goal Formulation: With patient Time For Goal Achievement: 05/27/16 Potential to Achieve Goals: Good Progress towards PT goals: Progressing toward goals    Frequency    7X/week      PT Plan Current plan remains appropriate    Co-evaluation             End of Session Equipment Utilized During Treatment: Gait belt Activity Tolerance: Patient tolerated treatment well Patient left: with call bell/phone within reach;Other (comment);with family/visitor present (EOB with husband and  NT)   PT Visit Diagnosis: Difficulty in walking, not elsewhere classified (R26.2)     Time: 1330-1420 PT Time Calculation (min) (ACUTE ONLY): 50 min  Charges:  $Gait Training: 38-52 mins                    G CodesKenyon Lawson, PT Pager: [phone] 05/21/2016    Dorothy Lawson 05/21/2016, 2:53 PM

## 2016-07-04 NOTE — Anesthesia Postprocedure Evaluation (Signed)
Anesthesia Post Note  Patient: Dorothy Lawson  Procedure(s) Performed: Procedure(s) (LRB): RIGHT TOTAL KNEE ARTHROPLASTY (Right)     Anesthesia Post Evaluation  Last Vitals:  Vitals:   05/21/16 1035 05/21/16 1300  BP: (!) 121/57 128/65  Pulse: 68 69  Resp:  16  Temp:  36.7 C    Last Pain:  Vitals:   05/21/16 1435  TempSrc:   PainSc: [name]

## 2016-07-04 NOTE — Addendum Note (Signed)
Addendum  created 07/04/16 1318 by Mayjor Ager D, MD   Sign clinical note    

## 2018-11-17 ENCOUNTER — Encounter (INDEPENDENT_AMBULATORY_CARE_PROVIDER_SITE_OTHER): Payer: Self-pay | Admitting: Nurse Practitioner

## 2019-01-26 ENCOUNTER — Other Ambulatory Visit: Payer: Self-pay

## 2019-01-26 ENCOUNTER — Ambulatory Visit (INDEPENDENT_AMBULATORY_CARE_PROVIDER_SITE_OTHER): Payer: Medicare Other | Admitting: Nurse Practitioner

## 2019-01-26 ENCOUNTER — Encounter (INDEPENDENT_AMBULATORY_CARE_PROVIDER_SITE_OTHER): Payer: Self-pay | Admitting: *Deleted

## 2019-01-26 ENCOUNTER — Encounter (INDEPENDENT_AMBULATORY_CARE_PROVIDER_SITE_OTHER): Payer: Self-pay | Admitting: Nurse Practitioner

## 2019-01-26 ENCOUNTER — Telehealth (INDEPENDENT_AMBULATORY_CARE_PROVIDER_SITE_OTHER): Payer: Self-pay | Admitting: *Deleted

## 2019-01-26 VITALS — BP 153/85 | HR 56 | Temp 96.8°F | Ht 65.0 in | Wt 182.2 lb

## 2019-01-26 DIAGNOSIS — Z7901 Long term (current) use of anticoagulants: Secondary | ICD-10-CM

## 2019-01-26 DIAGNOSIS — Z86010 Personal history of colonic polyps: Secondary | ICD-10-CM

## 2019-01-26 DIAGNOSIS — R195 Other fecal abnormalities: Secondary | ICD-10-CM

## 2019-01-26 MED ORDER — PLENVU 140 G PO SOLR: 1.0000 | Freq: Once | ORAL | 0 refills | Status: AC

## 2019-01-26 NOTE — Progress Notes (Addendum)
Patient profile: Dorothy Lawson is a 68 y.o. female seen for evaluation of +hemoccult . Referred by Dr. Woody Seller.  History of Present Illness: Dorothy Lawson is seen today for evaluation of positive Hemoccult.  She denies seeing blood in her stool or dark stools.  Her bowels are regular 1-2 stools a day without any straining.  She has no abdominal pain. She takes a fiber supplement which she finds helpful for regularity. Denies history of positive occult blood in the past. History of colon polyps.  She reports being put on Pepcid 2 years ago for some GERD symptoms and stomach pain, with Pepcid nightly she is not having GERD symptoms.  She does not have missed doses to assess for breakthrough symptoms.  She takes NSAIDs 2-3 times a month.  She denies any nausea vomiting.  She does have some dysphagia specifically to apples in her upper esophageal area.  This is mild and foods will pass with repetitive swallows or drinking water.  She is not regurgitating foods.  Appetite good, has gained some weight recently.  She has not been on Xarelto since 2018 following the knee replacement.  Wt Readings from Last 3 Encounters:  01/26/19 182 lb 3.2 oz (82.6 kg)  05/19/16 168 lb (76.2 kg)  05/12/16 168 lb 9.6 oz (76.5 kg)     Last Colonoscopy: 03/2014--(done for hx of colon adenoma)  Impression:  Normal mucosa of terminal ileum. Few small diverticula at sigmoid colon. External hemorrhoids. No evidence of recurrent polyps.   Last Endoscopy: None prior   Past Medical History:  Past Medical History:  Diagnosis Date  . Arthritis 05-03-12   osteoarthritis - knees  . GERD (gastroesophageal reflux disease)    tx. Prilosec  . Hypercholesterolemia 04-15-12   tx. meds  . Hypertension     Problem List: Patient Active Problem List   Diagnosis Date Noted  . Postoperative anemia due to acute blood loss 05/11/2012  . Hyponatremia 05/11/2012  . OA (osteoarthritis) of knee 05/10/2012    Past Surgical  History: Past Surgical History:  Procedure Laterality Date  . COLONOSCOPY N/A 03/16/2014   Procedure: COLONOSCOPY;  Surgeon: Rogene Houston, MD;  Location: [facility];  Service: Endoscopy;  Laterality: N/A;  1015 - moved to 2/11 @ 9:30  . COLONOSCOPY W/ POLYPECTOMY    . TOTAL KNEE ARTHROPLASTY Left 05/10/2012   Procedure: TOTAL KNEE ARTHROPLASTY;  Surgeon: [name], MD;  Location: [facility];  Service: Orthopedics;  Laterality: Left;  . TOTAL KNEE ARTHROPLASTY Right 05/19/2016   Procedure: RIGHT TOTAL KNEE ARTHROPLASTY;  Surgeon: Gaynelle Arabian, MD;  Location: [facility];  Service: Orthopedics;  Laterality: Right;  requests 39mins  . TUBAL LIGATION      Allergies: Allergies  Allergen Reactions  . Macrodantin [Nitrofurantoin]     Unsure reaction   . Keflex [Cephalexin] Rash      Home Medications:  Current Outpatient Medications:  .  acetaminophen (TYLENOL) 500 MG tablet, Take 1,000 mg by mouth every 6 (six) hours as needed (for pain.)., Disp: , Rfl:  .  aspirin EC 81 MG tablet, Take 81 mg by mouth daily., Disp: , Rfl:  .  atorvastatin (LIPITOR) 10 MG tablet, TAKE ONE TABLET BY MOUTH AT BEDTIME FOR CHOLESTEROL., Disp: , Rfl: 3 .  Calcium Carbonate-Vit D-Min (CALCIUM 1200 PO), Take by mouth 2 (two) times daily., Disp: , Rfl:  .  Cholecalciferol (VITAMIN D3) 10 MCG (400 UNIT) CAPS, Take by mouth 2 (two) times daily., Disp: ,  Rfl:  .  famotidine (PEPCID) 20 MG tablet, Take 20 mg by mouth at bedtime., Disp: , Rfl:  .  Glucosamine-Chondroit-Vit C-Mn (GLUCOSAMINE 1500 COMPLEX) CAPS, Take by mouth daily., Disp: , Rfl:  .  Magnesium 250 MG TABS, Take 250 mg by mouth daily. , Disp: , Rfl:  .  metoprolol (LOPRESSOR) 50 MG tablet, Take 50 mg by mouth 2 (two) times daily., Disp: , Rfl:  .  Multiple Vitamins-Minerals (CENTRUM SILVER 50+WOMEN PO), Take by mouth daily., Disp: , Rfl:  .  psyllium (METAMUCIL) 58.6 % packet, Take 1 packet by mouth at bedtime., Disp: , Rfl:  .   triamterene-hydrochlorothiazide (MAXZIDE-25) 37.5-25 MG per tablet, Take 0.5 tablets by mouth daily before breakfast. , Disp: , Rfl:    Family History: Sister with adenomatous polyps, no other family hx colon polyps or colon cancer.   Social History:   reports that she has never smoked. She has never used smokeless tobacco. She reports current alcohol use. She reports that she does not use drugs.   Review of Systems: Constitutional:+weight gain  Eyes: No changes in vision. ENT: No oral lesions, sore throat.  GI: see HPI.  Heme/Lymph: No easy bruising.  CV: No chest pain.  GU: No hematuria.  Integumentary: No rashes.  Neuro: No headaches.  Psych: No depression/anxiety.  Endocrine: No heat/cold intolerance.  Allergic/Immunologic: No urticaria.  Resp: No cough, SOB.  Musculoskeletal: No joint swelling.    Physical Examination: BP (!) 153/85 (BP Location: Right Arm, Patient Position: Sitting, Cuff Size: Large)   Pulse (!) 56   Temp (!) 96.8 F (36 C) (Oral)   Ht 5\' 5"  (1.651 m)   Wt 182 lb 3.2 oz (82.6 kg)   BMI 30.32 kg/m  Gen: NAD, alert and oriented x 4 HEENT: PEERLA, EOMI, Neck: supple, no JVD Chest: CTA bilaterally, no wheezes, crackles, or other adventitious sounds CV: RRR, no m/g/c/r Abd: soft, NT, ND, +BS in all four quadrants; no HSM, guarding, ridigity, or rebound tenderness Ext: no edema, well perfused with 2+ pulses, Skin: no rash or lesions noted on observed skin Lymph: no noted LAD   Assessment/Plan: Ms. Sturgell is a 68 y.o. female    Diagnoses and all orders for this visit:  Occult blood positive stool  Personal history of colonic polyps  GERD  Dysphagia     1. Positive occult blood - per PCP notes. CBC, CMP, TSH drawn at [facility] and will request these results to exclude anemia. Routinely due for colonoscopy 03/2019 (based on hx of adenomatous polyps on prior colon) and will schedule this.  Given below symptoms we will also add on endoscopy which she  has not had in the past.  2.  GERD/dysphagia-symptoms well controlled on Pepcid nightly which she will continue, only needing Tums after greasy or fried meals.  No prior endoscopy.  Has mild dysphagia to apples and will schedule EGD to evaluate GERD and dysphagia at time of colonoscopy.     Patient denies CP, SOB, and use of blood thinners. I discussed the risks and benefits of procedure including bleeding, perforation, infection, missed lesions, medication reactions and possible hospitalization or surgery if complications. All questions answered.   I personally performed the service, non-incident to. (WP)  Laurine Blazer, PA-C [facility]  I agree with the above assessment and plan [facility] CRNP

## 2019-01-26 NOTE — Telephone Encounter (Signed)
Patient needs plenvu  TCS/EGD sch'd 1/27

## 2019-02-02 DIAGNOSIS — Z860100 Personal history of colon polyps, unspecified: Secondary | ICD-10-CM | POA: Insufficient documentation

## 2019-02-02 DIAGNOSIS — Z7901 Long term (current) use of anticoagulants: Secondary | ICD-10-CM | POA: Insufficient documentation

## 2019-02-02 DIAGNOSIS — R195 Other fecal abnormalities: Secondary | ICD-10-CM | POA: Insufficient documentation

## 2019-02-07 ENCOUNTER — Other Ambulatory Visit (INDEPENDENT_AMBULATORY_CARE_PROVIDER_SITE_OTHER): Payer: Self-pay | Admitting: *Deleted

## 2019-02-14 ENCOUNTER — Other Ambulatory Visit (INDEPENDENT_AMBULATORY_CARE_PROVIDER_SITE_OTHER): Payer: Self-pay | Admitting: Internal Medicine

## 2019-02-14 DIAGNOSIS — R1319 Other dysphagia: Secondary | ICD-10-CM

## 2019-02-14 DIAGNOSIS — R195 Other fecal abnormalities: Secondary | ICD-10-CM

## 2019-02-15 ENCOUNTER — Other Ambulatory Visit (INDEPENDENT_AMBULATORY_CARE_PROVIDER_SITE_OTHER): Payer: Self-pay | Admitting: *Deleted

## 2019-02-15 ENCOUNTER — Other Ambulatory Visit (INDEPENDENT_AMBULATORY_CARE_PROVIDER_SITE_OTHER): Payer: Self-pay | Admitting: Internal Medicine

## 2019-02-15 DIAGNOSIS — R1319 Other dysphagia: Secondary | ICD-10-CM | POA: Insufficient documentation

## 2019-02-24 ENCOUNTER — Other Ambulatory Visit (INDEPENDENT_AMBULATORY_CARE_PROVIDER_SITE_OTHER): Payer: Self-pay | Admitting: *Deleted

## 2019-02-25 NOTE — Patient Instructions (Signed)
Dorothy Lawson  02/25/2019     @PREFPERIOPPHARMACY @   Your procedure is scheduled on  03/02/2019.  Report to [facility] at  0900  A.M.  Call this number if you have problems the morning of surgery:  [phone]   Remember:  Follow the diet and prep instructions given to you by Dr Olevia Perches office.                     Take these medicines the morning of surgery with A SIP OF WATER metoprolol.    Do not wear jewelry, make-up or nail polish.  Do not wear lotions, powders, or perfumes. Please wear deodorant and brush your teeth.  Do not shave 48 hours prior to surgery.  Men may shave face and neck.  Do not bring valuables to the hospital.  [facility] is not responsible for any belongings or valuables.  Contacts, dentures or bridgework may not be worn into surgery.  Leave your suitcase in the car.  After surgery it may be brought to your room.  For patients admitted to the hospital, discharge time will be determined by your treatment team.  Patients discharged the day of surgery will not be allowed to drive home.   Name and phone number of your driver:   family Special instructions:  None  Please read over the following fact sheets that you were given. Anesthesia Post-op Instructions and Care and Recovery After Surgery       Upper Endoscopy, Adult, Care After This sheet gives you information about how to care for yourself after your procedure. Your health care provider may also give you more specific instructions. If you have problems or questions, contact your health care provider. What can I expect after the procedure? After the procedure, it is common to have:  A sore throat.  Mild stomach pain or discomfort.  Bloating.  Nausea. Follow these instructions at home:   Follow instructions from your health care provider about what to eat or drink after your procedure.  Return to your normal activities as told by your health care provider. Ask your  health care provider what activities are safe for you.  Take over-the-counter and prescription medicines only as told by your health care provider.  Do not drive for 24 hours if you were given a sedative during your procedure.  Keep all follow-up visits as told by your health care provider. This is important. Contact a health care provider if you have:  A sore throat that lasts longer than one day.  Trouble swallowing. Get help right away if:  You vomit blood or your vomit looks like coffee grounds.  You have: ? A fever. ? Bloody, black, or tarry stools. ? A severe sore throat or you cannot swallow. ? Difficulty breathing. ? Severe pain in your chest or abdomen. Summary  After the procedure, it is common to have a sore throat, mild stomach discomfort, bloating, and nausea.  Do not drive for 24 hours if you were given a sedative during the procedure.  Follow instructions from your health care provider about what to eat or drink after your procedure.  Return to your normal activities as told by your health care provider. This information is not intended to replace advice given to you by your health care provider. Make sure you discuss any questions you have with your health care provider. Document Revised: 07/14/2017 Document Reviewed: 06/22/2017 Elsevier Patient Education  [phone]  [place].  Colonoscopy, Adult, Care After This sheet gives you information about how to care for yourself after your procedure. Your health care provider may also give you more specific instructions. If you have problems or questions, contact your health care provider. What can I expect after the procedure? After the procedure, it is common to have:  A small amount of blood in your stool for 24 hours after the procedure.  Some gas.  Mild cramping or bloating of your abdomen. Follow these instructions at home: Eating and drinking   Drink enough fluid to keep your urine pale yellow.  Follow  instructions from your health care provider about eating or drinking restrictions.  Resume your normal diet as instructed by your health care provider. Avoid heavy or fried foods that are hard to digest. Activity  Rest as told by your health care provider.  Avoid sitting for a long time without moving. Get up to take short walks every 1-2 hours. This is important to improve blood flow and breathing. Ask for help if you feel weak or unsteady.  Return to your normal activities as told by your health care provider. Ask your health care provider what activities are safe for you. Managing cramping and bloating   Try walking around when you have cramps or feel bloated.  Apply heat to your abdomen as told by your health care provider. Use the heat source that your health care provider recommends, such as a moist heat pack or a heating pad. ? Place a towel between your skin and the heat source. ? Leave the heat on for 20-30 minutes. ? Remove the heat if your skin turns bright red. This is especially important if you are unable to feel pain, heat, or cold. You may have a greater risk of getting burned. General instructions  For the first 24 hours after the procedure: ? Do not drive or use machinery. ? Do not sign important documents. ? Do not drink alcohol. ? Do your regular daily activities at a slower pace than normal. ? Eat soft foods that are easy to digest.  Take over-the-counter and prescription medicines only as told by your health care provider.  Keep all follow-up visits as told by your health care provider. This is important. Contact a health care provider if:  You have blood in your stool 2-3 days after the procedure. Get help right away if you have:  More than a small spotting of blood in your stool.  Large blood clots in your stool.  Swelling of your abdomen.  Nausea or vomiting.  A fever.  Increasing pain in your abdomen that is not relieved with  medicine. Summary  After the procedure, it is common to have a small amount of blood in your stool. You may also have mild cramping and bloating of your abdomen.  For the first 24 hours after the procedure, do not drive or use machinery, sign important documents, or drink alcohol.  Get help right away if you have a lot of blood in your stool, nausea or vomiting, a fever, or increased pain in your abdomen. This information is not intended to replace advice given to you by your health care provider. Make sure you discuss any questions you have with your health care provider. Document Revised: 08/16/2018 Document Reviewed: 08/16/2018 Elsevier Patient Education  [place] After These instructions provide you with information about caring for yourself after your procedure. Your health care provider may also give  you more specific instructions. Your treatment has been planned according to current medical practices, but problems sometimes occur. Call your health care provider if you have any problems or questions after your procedure. What can I expect after the procedure? After your procedure, you may:  Feel sleepy for several hours.  Feel clumsy and have poor balance for several hours.  Feel forgetful about what happened after the procedure.  Have poor judgment for several hours.  Feel nauseous or vomit.  Have a sore throat if you had a breathing tube during the procedure. Follow these instructions at home: For at least 24 hours after the procedure:      Have a responsible adult stay with you. It is important to have someone help care for you until you are awake and alert.  Rest as needed.  Do not: ? Participate in activities in which you could fall or become injured. ? Drive. ? Use heavy machinery. ? Drink alcohol. ? Take sleeping pills or medicines that cause drowsiness. ? Make important decisions or sign legal documents. ? Take care  of children on your own. Eating and drinking  Follow the diet that is recommended by your health care provider.  If you vomit, drink water, juice, or soup when you can drink without vomiting.  Make sure you have little or no nausea before eating solid foods. General instructions  Take over-the-counter and prescription medicines only as told by your health care provider.  If you have sleep apnea, surgery and certain medicines can increase your risk for breathing problems. Follow instructions from your health care provider about wearing your sleep device: ? Anytime you are sleeping, including during daytime naps. ? While taking prescription pain medicines, sleeping medicines, or medicines that make you drowsy.  If you smoke, do not smoke without supervision.  Keep all follow-up visits as told by your health care provider. This is important. Contact a health care provider if:  You keep feeling nauseous or you keep vomiting.  You feel light-headed.  You develop a rash.  You have a fever. Get help right away if:  You have trouble breathing. Summary  For several hours after your procedure, you may feel sleepy and have poor judgment.  Have a responsible adult stay with you for at least 24 hours or until you are awake and alert. This information is not intended to replace advice given to you by your health care provider. Make sure you discuss any questions you have with your health care provider. Document Revised: 04/20/2017 Document Reviewed: 05/13/2015 Elsevier Patient Education  [place].

## 2019-02-28 ENCOUNTER — Other Ambulatory Visit (HOSPITAL_COMMUNITY)
Admission: RE | Admit: 2019-02-28 | Discharge: 2019-02-28 | Disposition: A | Discharge status: Home / Self Care | Payer: Medicare PPO | Source: Ambulatory Visit | Attending: Internal Medicine | Admitting: Internal Medicine

## 2019-02-28 ENCOUNTER — Other Ambulatory Visit: Payer: Self-pay

## 2019-02-28 ENCOUNTER — Other Ambulatory Visit (HOSPITAL_COMMUNITY): Payer: Medicare Other

## 2019-02-28 ENCOUNTER — Encounter (HOSPITAL_COMMUNITY)
Admission: RE | Admit: 2019-02-28 | Discharge: 2019-02-28 | Disposition: A | Discharge status: Home / Self Care | Payer: Medicare PPO | Source: Ambulatory Visit | Attending: Internal Medicine | Admitting: Internal Medicine

## 2019-02-28 DIAGNOSIS — R1310 Dysphagia, unspecified: Secondary | ICD-10-CM | POA: Insufficient documentation

## 2019-02-28 DIAGNOSIS — K5790 Diverticulosis of intestine, part unspecified, without perforation or abscess without bleeding: Secondary | ICD-10-CM | POA: Insufficient documentation

## 2019-02-28 DIAGNOSIS — K228 Other specified diseases of esophagus: Secondary | ICD-10-CM | POA: Diagnosis not present

## 2019-02-28 DIAGNOSIS — Z01812 Encounter for preprocedural laboratory examination: Secondary | ICD-10-CM | POA: Insufficient documentation

## 2019-02-28 DIAGNOSIS — K649 Unspecified hemorrhoids: Secondary | ICD-10-CM | POA: Diagnosis not present

## 2019-02-28 DIAGNOSIS — K209 Esophagitis, unspecified without bleeding: Secondary | ICD-10-CM | POA: Insufficient documentation

## 2019-02-28 DIAGNOSIS — K449 Diaphragmatic hernia without obstruction or gangrene: Secondary | ICD-10-CM | POA: Diagnosis not present

## 2019-02-28 LAB — CBC WITH DIFFERENTIAL/PLATELET
Abs Immature Granulocytes: 0.01 10*3/uL (ref 0.00–0.07)
Basophils Absolute: 0.1 10*3/uL (ref 0.0–0.1)
Basophils Relative: 1 %
Eosinophils Absolute: 0.2 10*3/uL (ref 0.0–0.5)
Eosinophils Relative: 3 %
HCT: 42 % (ref 36.0–46.0)
Hemoglobin: 13.7 g/dL (ref 12.0–15.0)
Immature Granulocytes: 0 %
Lymphocytes Relative: 24 %
Lymphs Abs: 1.5 10*3/uL (ref 0.7–4.0)
MCH: 30.9 pg (ref 26.0–34.0)
MCHC: 32.6 g/dL (ref 30.0–36.0)
MCV: 94.6 fL (ref 80.0–100.0)
Monocytes Absolute: 0.6 10*3/uL (ref 0.1–1.0)
Monocytes Relative: 9 %
Neutro Abs: 4.1 10*3/uL (ref 1.7–7.7)
Neutrophils Relative %: 63 %
Platelets: 265 10*3/uL (ref 150–400)
RBC: 4.44 MIL/uL (ref 3.87–5.11)
RDW: 12.2 % (ref 11.5–15.5)
WBC: 6.5 10*3/uL (ref 4.0–10.5)
nRBC: 0 % (ref 0.0–0.2)

## 2019-02-28 LAB — BASIC METABOLIC PANEL
Anion gap: 10 (ref 5–15)
BUN: 23 mg/dL (ref 8–23)
CO2: 27 mmol/L (ref 22–32)
Calcium: 9.6 mg/dL (ref 8.9–10.3)
Chloride: 101 mmol/L (ref 98–111)
Creatinine, Ser: 0.89 mg/dL (ref 0.44–1.00)
GFR calc Af Amer: >60 mL/min (ref >60)
GFR calc non Af Amer: >60 mL/min (ref >60)
Glucose, Bld: 97 mg/dL (ref 70–99)
Potassium: 4 mmol/L (ref 3.5–5.1)
Sodium: 138 mmol/L (ref 135–145)

## 2019-02-28 LAB — SARS CORONAVIRUS 2 (TAT 6-24 HRS): SARS Coronavirus 2: NEGATIVE

## 2019-03-02 ENCOUNTER — Encounter (HOSPITAL_COMMUNITY): Payer: Self-pay

## 2019-03-02 ENCOUNTER — Ambulatory Visit (HOSPITAL_COMMUNITY)
Admission: RE | Admit: 2019-03-02 | Discharge: 2019-03-02 | Disposition: A | Discharge status: Home / Self Care | Payer: Medicare PPO | Attending: Internal Medicine | Admitting: Internal Medicine

## 2019-03-02 ENCOUNTER — Encounter (HOSPITAL_COMMUNITY): Payer: Self-pay | Admitting: Internal Medicine

## 2019-03-02 ENCOUNTER — Other Ambulatory Visit: Payer: Self-pay

## 2019-03-02 ENCOUNTER — Ambulatory Visit (HOSPITAL_COMMUNITY): Payer: Medicare PPO | Admitting: Anesthesiology

## 2019-03-02 ENCOUNTER — Ambulatory Visit (HOSPITAL_COMMUNITY): Admit: 2019-03-02 | Payer: Medicare Other | Admitting: Internal Medicine

## 2019-03-02 ENCOUNTER — Encounter (HOSPITAL_COMMUNITY)
Admission: RE | Disposition: A | Discharge status: Home / Self Care | Payer: Self-pay | Source: Home / Self Care | Attending: Internal Medicine

## 2019-03-02 DIAGNOSIS — K228 Other specified diseases of esophagus: Secondary | ICD-10-CM | POA: Diagnosis not present

## 2019-03-02 DIAGNOSIS — Z79899 Other long term (current) drug therapy: Secondary | ICD-10-CM | POA: Diagnosis not present

## 2019-03-02 DIAGNOSIS — K2100 Gastro-esophageal reflux disease with esophagitis, without bleeding: Secondary | ICD-10-CM | POA: Insufficient documentation

## 2019-03-02 DIAGNOSIS — R195 Other fecal abnormalities: Secondary | ICD-10-CM | POA: Insufficient documentation

## 2019-03-02 DIAGNOSIS — Z881 Allergy status to other antibiotic agents status: Secondary | ICD-10-CM | POA: Diagnosis not present

## 2019-03-02 DIAGNOSIS — K2970 Gastritis, unspecified, without bleeding: Secondary | ICD-10-CM

## 2019-03-02 DIAGNOSIS — R1314 Dysphagia, pharyngoesophageal phase: Secondary | ICD-10-CM | POA: Insufficient documentation

## 2019-03-02 DIAGNOSIS — D130 Benign neoplasm of esophagus: Secondary | ICD-10-CM | POA: Insufficient documentation

## 2019-03-02 DIAGNOSIS — K219 Gastro-esophageal reflux disease without esophagitis: Secondary | ICD-10-CM | POA: Diagnosis not present

## 2019-03-02 DIAGNOSIS — E7800 Pure hypercholesterolemia, unspecified: Secondary | ICD-10-CM | POA: Diagnosis not present

## 2019-03-02 DIAGNOSIS — Z7982 Long term (current) use of aspirin: Secondary | ICD-10-CM | POA: Insufficient documentation

## 2019-03-02 DIAGNOSIS — K319 Disease of stomach and duodenum, unspecified: Secondary | ICD-10-CM | POA: Diagnosis not present

## 2019-03-02 DIAGNOSIS — K449 Diaphragmatic hernia without obstruction or gangrene: Secondary | ICD-10-CM

## 2019-03-02 DIAGNOSIS — Z96653 Presence of artificial knee joint, bilateral: Secondary | ICD-10-CM | POA: Insufficient documentation

## 2019-03-02 DIAGNOSIS — K644 Residual hemorrhoidal skin tags: Secondary | ICD-10-CM | POA: Insufficient documentation

## 2019-03-02 DIAGNOSIS — K5730 Diverticulosis of large intestine without perforation or abscess without bleeding: Secondary | ICD-10-CM | POA: Insufficient documentation

## 2019-03-02 DIAGNOSIS — Z86010 Personal history of colonic polyps: Secondary | ICD-10-CM | POA: Diagnosis not present

## 2019-03-02 DIAGNOSIS — I10 Essential (primary) hypertension: Secondary | ICD-10-CM | POA: Diagnosis not present

## 2019-03-02 DIAGNOSIS — K6289 Other specified diseases of anus and rectum: Secondary | ICD-10-CM | POA: Diagnosis not present

## 2019-03-02 DIAGNOSIS — R1319 Other dysphagia: Secondary | ICD-10-CM | POA: Insufficient documentation

## 2019-03-02 HISTORY — PX: COLONOSCOPY WITH PROPOFOL: SHX5780

## 2019-03-02 HISTORY — PX: BIOPSY: SHX5522

## 2019-03-02 HISTORY — PX: ESOPHAGOGASTRODUODENOSCOPY (EGD) WITH PROPOFOL: SHX5813

## 2019-03-02 HISTORY — PX: POLYPECTOMY: SHX5525

## 2019-03-02 SURGERY — COLONOSCOPY WITH PROPOFOL
Anesthesia: General

## 2019-03-02 SURGERY — COLONOSCOPY
Anesthesia: Moderate Sedation

## 2019-03-02 MED ORDER — HYDROCODONE-ACETAMINOPHEN 7.5-325 MG PO TABS: 1.0000 | ORAL_TABLET | Freq: Once | ORAL | Status: DC | PRN

## 2019-03-02 MED ORDER — MIDAZOLAM HCL 2 MG/2ML IJ SOLN: 0.5000 mg | Freq: Once | INTRAMUSCULAR | Status: DC | PRN

## 2019-03-02 MED ORDER — KETAMINE HCL 50 MG/5ML IJ SOSY
PREFILLED_SYRINGE | INTRAMUSCULAR | Status: AC
  Filled 2019-03-02: qty 5

## 2019-03-02 MED ORDER — LIDOCAINE HCL (CARDIAC) PF 100 MG/5ML IV SOSY
PREFILLED_SYRINGE | INTRAVENOUS | Status: DC | PRN
  Administered 2019-03-02: 60 mg via INTRAVENOUS

## 2019-03-02 MED ORDER — HYDROMORPHONE HCL 1 MG/ML IJ SOLN: 0.2500 mg | INTRAMUSCULAR | Status: DC | PRN

## 2019-03-02 MED ORDER — PROMETHAZINE HCL 25 MG/ML IJ SOLN: 6.2500 mg | INTRAMUSCULAR | Status: DC | PRN

## 2019-03-02 MED ORDER — PHENYLEPHRINE HCL (PRESSORS) 10 MG/ML IV SOLN
INTRAVENOUS | Status: DC | PRN
  Administered 2019-03-02 (×6): 100 ug via INTRAVENOUS

## 2019-03-02 MED ORDER — CHLORHEXIDINE GLUCONATE CLOTH 2 % EX PADS: 6.0000 | MEDICATED_PAD | Freq: Once | CUTANEOUS | Status: DC

## 2019-03-02 MED ORDER — PROPOFOL 500 MG/50ML IV EMUL
INTRAVENOUS | Status: DC | PRN
  Administered 2019-03-02: 150 ug/kg/min via INTRAVENOUS

## 2019-03-02 MED ORDER — KETAMINE HCL 10 MG/ML IJ SOLN
INTRAMUSCULAR | Status: DC | PRN
  Administered 2019-03-02: 30 mg via INTRAVENOUS
  Administered 2019-03-02: 20 mg via INTRAVENOUS

## 2019-03-02 MED ORDER — PROPOFOL 10 MG/ML IV BOLUS
INTRAVENOUS | Status: AC
  Filled 2019-03-02: qty 40

## 2019-03-02 MED ORDER — LACTATED RINGERS IV SOLN
INTRAVENOUS | Status: DC
  Administered 2019-03-02: 1000 mL via INTRAVENOUS

## 2019-03-02 MED ORDER — PANTOPRAZOLE SODIUM 40 MG PO TBEC: 40.0000 mg | DELAYED_RELEASE_TABLET | Freq: Every day | ORAL | 3 refills | Status: DC

## 2019-03-02 MED ORDER — STERILE WATER FOR IRRIGATION IR SOLN
Status: DC | PRN
  Administered 2019-03-02: 1.5 mL

## 2019-03-02 NOTE — Anesthesia Preprocedure Evaluation (Signed)
Anesthesia Evaluation  Patient identified by MRN, date of birth, ID band Patient awake    Reviewed: Allergy & Precautions, NPO status , Patient's Chart, lab work & pertinent test results  Airway Mallampati: I  TM Distance: >3 FB Neck ROM: Full    Dental no notable dental hx. (+) Teeth Intact   Pulmonary neg pulmonary ROS,    Pulmonary exam normal breath sounds clear to auscultation       Cardiovascular Exercise Tolerance: Good hypertension, Pt. on medications negative cardio ROS Normal cardiovascular examI Rhythm:Regular Rate:Normal  Swims  Does recumbent bike  Had Bilat TKAs 2012,2018   Neuro/Psych negative neurological ROS  negative psych ROS   GI/Hepatic Neg liver ROS, GERD  Medicated and Controlled,  Endo/Other  negative endocrine ROS  Renal/GU negative Renal ROS  negative genitourinary   Musculoskeletal  (+) Arthritis , Osteoarthritis,    Abdominal   Peds negative pediatric ROS (+)  Hematology negative hematology ROS (+) HgB >13   Anesthesia Other Findings   Reproductive/Obstetrics negative OB ROS                             Anesthesia Physical Anesthesia Plan  ASA: II  Anesthesia Plan: General   Post-op Pain Management:    Induction: Intravenous  PONV Risk Score and Plan: 3 and TIVA, Propofol infusion, Ondansetron and Treatment may vary due to age or medical condition  Airway Management Planned: Nasal Cannula and Simple Face Mask  Additional Equipment:   Intra-op Plan:   Post-operative Plan:   Informed Consent: I have reviewed the patients History and Physical, chart, labs and discussed the procedure including the risks, benefits and alternatives for the proposed anesthesia with the patient or authorized representative who has indicated his/her understanding and acceptance.     Dental advisory given  Plan Discussed with: CRNA  Anesthesia Plan Comments: (Plan  Full PPE use  Plan GA with GETA as needed d/w pt -WTP with same after Q&A)        Anesthesia Quick Evaluation

## 2019-03-02 NOTE — H&P (Signed)
Dorothy Lawson is an 69 y.o. female.   Chief Complaint: Patient is here for esophagogastroduodenoscopy with esophageal dilation and colonoscopy. HPI: Patient 69 year old Caucasian female who has history of colonic adenoma in 2008.  She did not have any polyps on her last exam of February 2016.  She recently had Hemoccult and was positive.  She denies melena rectal bleeding abdominal pain diarrhea constipation.  Family history significant for adenomas in her sister who is 4 years younger.  Patient also complains of dysphagia to solids primarily to apples and bread.  She points to upper sternal area site of bolus obstruction.  She has had it off and on for months.  She says heartburn is well controlled with famotidine.  She is watching her diet. Last aspirin dose was 3 days ago. Family history is negative for CRC.  Past Medical History:  Diagnosis Date  . Arthritis 05-03-12   osteoarthritis - knees  . GERD (gastroesophageal reflux disease)    tx. Prilosec  . Hypercholesterolemia 04-15-12   tx. meds  . Hypertension     Past Surgical History:  Procedure Laterality Date  . COLONOSCOPY N/A 03/16/2014   Procedure: COLONOSCOPY;  Surgeon: Rogene Houston, MD;  Location: [facility];  Service: Endoscopy;  Laterality: N/A;  1015 - moved to 2/11 @ 9:30  . COLONOSCOPY W/ POLYPECTOMY    . TOTAL KNEE ARTHROPLASTY Left 05/10/2012   Procedure: TOTAL KNEE ARTHROPLASTY;  Surgeon: [name], MD;  Location: [facility];  Service: Orthopedics;  Laterality: Left;  . TOTAL KNEE ARTHROPLASTY Right 05/19/2016   Procedure: RIGHT TOTAL KNEE ARTHROPLASTY;  Surgeon: Gaynelle Arabian, MD;  Location: [facility];  Service: Orthopedics;  Laterality: Right;  requests 28mins  . TUBAL LIGATION      History reviewed. No pertinent family history. Social History:  reports that she has never smoked. She has never used smokeless tobacco. She reports current alcohol use. She reports that she does not use drugs.  Allergies:   Allergies  Allergen Reactions  . Macrodantin [Nitrofurantoin] Other (See Comments)    Bumps in mouth   . Keflex [Cephalexin] Rash    Medications Prior to Admission  Medication Sig Dispense Refill  . acetaminophen (TYLENOL) 500 MG tablet Take 1,000 mg by mouth every 6 (six) hours as needed (for pain.).    Marland Kitchen aspirin EC 81 MG tablet Take 81 mg by mouth at bedtime.     Marland Kitchen atorvastatin (LIPITOR) 10 MG tablet Take 10 mg by mouth at bedtime.   3  . Calcium Carbonate-Vit D-Min (CALCIUM 1200 PO) Take 1 tablet by mouth 2 (two) times daily.     . Cholecalciferol (VITAMIN D3) 10 MCG (400 UNIT) CAPS Take 1 tablet by mouth 2 (two) times daily.     . famotidine (PEPCID) 20 MG tablet Take 20 mg by mouth at bedtime.    . Glucosamine-Chondroit-Vit C-Mn (GLUCOSAMINE 1500 COMPLEX) CAPS Take 1 capsule by mouth daily.     . Magnesium 250 MG TABS Take 250 mg by mouth daily.     . metoprolol (LOPRESSOR) 50 MG tablet Take 50 mg by mouth 2 (two) times daily.    . Multiple Vitamins-Minerals (CENTRUM SILVER 50+WOMEN PO) Take 1 tablet by mouth daily.     Marland Kitchen Propylene Glycol (SYSTANE COMPLETE OP) Place 1 drop into both eyes 2 (two) times daily.    . psyllium (METAMUCIL) 58.6 % packet Take 1 packet by mouth at bedtime.    . triamterene-hydrochlorothiazide (MAXZIDE-25) 37.5-25 MG per tablet  Take 0.5 tablets by mouth daily.       Results for orders placed or performed during the hospital encounter of 02/28/19 (from the past 48 hour(s))  CBC with Differential/Platelet     Status: None   Collection Time: 02/28/19 10:25 AM  Result Value Ref Range   WBC 6.5 4.0 - 10.5 K/uL   RBC 4.44 3.87 - 5.11 MIL/uL   Hemoglobin 13.7 12.0 - 15.0 g/dL   HCT 42.0 36.0 - 46.0 %   MCV 94.6 80.0 - 100.0 fL   MCH 30.9 26.0 - 34.0 pg   MCHC 32.6 30.0 - 36.0 g/dL   RDW 12.2 11.5 - 15.5 %   Platelets 265 150 - 400 K/uL   nRBC 0.0 0.0 - 0.2 %   Neutrophils Relative % 63 %   Neutro Abs 4.1 1.7 - 7.7 K/uL   Lymphocytes Relative 24 %    Lymphs Abs 1.5 0.7 - 4.0 K/uL   Monocytes Relative 9 %   Monocytes Absolute 0.6 0.1 - 1.0 K/uL   Eosinophils Relative 3 %   Eosinophils Absolute 0.2 0.0 - 0.5 K/uL   Basophils Relative 1 %   Basophils Absolute 0.1 0.0 - 0.1 K/uL   Immature Granulocytes 0 %   Abs Immature Granulocytes 0.01 0.00 - 0.07 K/uL    Comment: Performed at [facility], [address] [place], [place] [id number]  Basic metabolic panel     Status: None   Collection Time: 02/28/19 10:25 AM  Result Value Ref Range   Sodium 138 135 - 145 mmol/L   Potassium 4.0 3.5 - 5.1 mmol/L   Chloride 101 98 - 111 mmol/L   CO2 27 22 - 32 mmol/L   Glucose, Bld 97 70 - 99 mg/dL   BUN 23 8 - 23 mg/dL   Creatinine, Ser 0.89 0.44 - 1.00 mg/dL   Calcium 9.6 8.9 - 10.3 mg/dL   GFR calc non Af Amer >60 >60 mL/min   GFR calc Af Amer >60 >60 mL/min   Anion gap 10 5 - 15    Comment: Performed at [facility], [address], [place], [place] [address]   No results found.  Review of Systems  Blood pressure 140/84, pulse 72, temperature 98.9 F (37.2 C), temperature source Oral, resp. rate 18, SpO2 98 %. Physical Exam  Constitutional: She appears well-developed and well-nourished.  HENT:  Mouth/Throat: Oropharynx is clear and moist.  Eyes: Conjunctivae are normal. No scleral icterus.  Neck: No thyromegaly present.  Cardiovascular: Normal rate, regular rhythm and normal heart sounds.  No murmur heard. Respiratory: Effort normal and breath sounds normal.  GI: Soft. She exhibits no distension and no mass. There is no abdominal tenderness.  Musculoskeletal:        General: No edema.  Lymphadenopathy:    She has no cervical adenopathy.  Neurological: She is alert.  Skin: Skin is warm and dry.     Assessment/Plan Esophageal dysphagia. Heme positive stool. History of colonic adenoma. Esophagogastroduodenoscopy with esophageal dilation and diagnostic colonoscopy.  Hildred Laser, MD 03/02/2019, 10:10 AM

## 2019-03-02 NOTE — Op Note (Signed)
[facility] Patient Name: Dorothy Lawson Procedure Date: 03/02/2019 10:00 AM MRN: [id number] Date of Birth: 10-28-1950 Attending MD: Hildred Laser , MD CSN: [id number] Age: 69 Admit Type: Outpatient Procedure:                Upper GI endoscopy Indications:              Esophageal dysphagia, Gastro-esophageal reflux                            disease Providers:                Hildred Laser, MD, Charlsie Quest. Theda Sers RN, RN,                            Raphael Gibney, Technician Referring MD:             Arsenio Katz, PA Medicines:                Propofol per Anesthesia Complications:            No immediate complications. Estimated Blood Loss:     Estimated blood loss was minimal. Procedure:                Pre-Anesthesia Assessment:                           - Prior to the procedure, a History and Physical                            was performed, and patient medications and                            allergies were reviewed. The patient's tolerance of                            previous anesthesia was also reviewed. The risks                            and benefits of the procedure and the sedation                            options and risks were discussed with the patient.                            All questions were answered, and informed consent                            was obtained. Prior Anticoagulants: The patient has                            taken no previous anticoagulant or antiplatelet                            agents except for aspirin. ASA Grade Assessment: II                            -  A patient with mild systemic disease. After                            reviewing the risks and benefits, the patient was                            deemed in satisfactory condition to undergo the                            procedure.                           After obtaining informed consent, the endoscope was                            passed under direct vision. Throughout the                             procedure, the patient's blood pressure, pulse, and                            oxygen saturations were monitored continuously. The                            [id number] scope was introduced through the                            mouth, and advanced to the second part of duodenum.                            The upper GI endoscopy was accomplished without                            difficulty. The patient tolerated the procedure                            well. Scope In: 10:21:02 AM Scope Out: 10:35:00 AM Total Procedure Duration: 0 hours 13 minutes 58 seconds  Findings:      A single 5 mm polyp was found 29 cm from the incisors. Biopsies were       taken with a cold forceps for histology. The pathology specimen was       placed into Bottle Number 2.      LA Grade A (one or more mucosal breaks less than 5 mm, not extending       between tops of 2 mucosal folds) esophagitis with no bleeding was found       34 to 35 cm from the incisors.      A 3 cm hiatal hernia was present.      No endoscopic abnormality was evident in the esophagus to explain the       patient's complaint of dysphagia. It was decided, however, to proceed       with dilation of the entire esophagus. The scope was withdrawn. Dilation       was performed with a Maloney dilator with no resistance at 16 Fr. The  dilation site was examined following endoscope reinsertion and showed no       change and no bleeding, mucosal tear or perforation.      Patchy inflammation characterized by congestion (edema), erosions and       erythema was found in the gastric antrum. This was biopsied with a cold       forceps for histology. The pathology specimen was placed into Bottle       Number 2.      The exam of the stomach was otherwise normal.      The duodenal bulb and second portion of the duodenum were normal. Impression:               - Esophageal polyp was found. Ablated via cold                             biopsy.                           - LA Grade A reflux esophagitis with no bleeding.                           - 3 cm hiatal hernia.                           - No endoscopic esophageal abnormality to explain                            patient's dysphagia. Esophagus dilated prior to                            biopsy. Dilated.                           - Gastritis. Biopsied.                           - Normal duodenal bulb and second portion of the                            duodenum. Moderate Sedation:      Per Anesthesia Care Recommendation:           - Patient has a contact number available for                            emergencies. The signs and symptoms of potential                            delayed complications were discussed with the                            patient. Return to normal activities tomorrow.                            Written discharge instructions were provided to the  patient.                           - Resume previous diet today.                           - Continue present medications .                           - Use Protonix (pantoprazole) 40 mg PO daily daily.                           - Await pathology results.                           - No aspirin, ibuprofen, naproxen, or other                            non-steroidal anti-inflammatory drugs for 1 day. Procedure Code(s):        --- Professional ---                           [phone], Esophagogastroduodenoscopy, flexible,                            transoral; with biopsy, single or multiple                           43450, Dilation of esophagus, by unguided sound or                            bougie, single or multiple passes Diagnosis Code(s):        --- Professional ---                           K22.8, Other specified diseases of esophagus                           K21.00, Gastro-esophageal reflux disease with                            esophagitis, without  bleeding                           K44.9, Diaphragmatic hernia without obstruction or                            gangrene                           K29.70, Gastritis, unspecified, without bleeding                           R13.14, Dysphagia, pharyngoesophageal phase CPT copyright 2019 American Medical Association. All rights reserved. The codes documented in this report are preliminary and upon coder review may  be revised to meet current compliance requirements. Hildred Laser, MD Hildred Laser, MD 03/02/2019 11:06:44 AM  This report has been signed electronically. Number of Addenda: 0

## 2019-03-02 NOTE — Op Note (Signed)
[facility] Patient Name: Dorothy Lawson Procedure Date: 03/02/2019 10:38 AM MRN: [id number] Date of Birth: 09/11/1950 Attending MD: Hildred Laser , MD CSN: [id number] Age: 69 Admit Type: Outpatient Procedure:                Colonoscopy Indications:              Heme positive stool; history of tubular adenoma. Providers:                Hildred Laser, MD, Charlsie Quest Theda Sers RN, RN,                            Raphael Gibney, Technician Referring MD:             Arsenio Katz, PA Medicines:                Propofol per Anesthesia Complications:            No immediate complications. Estimated Blood Loss:     Estimated blood loss: none. Procedure:                Pre-Anesthesia Assessment:                           - Prior to the procedure, a History and Physical                            was performed, and patient medications and                            allergies were reviewed. The patient's tolerance of                            previous anesthesia was also reviewed. The risks                            and benefits of the procedure and the sedation                            options and risks were discussed with the patient.                            All questions were answered, and informed consent                            was obtained. Prior Anticoagulants: The patient has                            taken no previous anticoagulant or antiplatelet                            agents except for aspirin. ASA Grade Assessment: II                            - A patient with mild systemic disease. After  reviewing the risks and benefits, the patient was                            deemed in satisfactory condition to undergo the                            procedure.                           After obtaining informed consent, the colonoscope                            was passed under direct vision. Throughout the                            procedure, the  patient's blood pressure, pulse, and                            oxygen saturations were monitored continuously. The                            [id number] scope was introduced through                            the anus and advanced to the the terminal ileum,                            with identification of the appendiceal orifice and                            IC valve. The colonoscopy was somewhat difficult                            due to restricted mobility of the sigmoid colon.                            The patient tolerated the procedure well. The                            quality of the bowel preparation was good. Scope In: 10:39:38 AM Scope Out: 10:53:19 AM Scope Withdrawal Time: 0 hours 5 minutes 44 seconds  Total Procedure Duration: 0 hours 13 minutes 41 seconds  Findings:      The perianal and digital rectal examinations were normal.      The terminal ileum appeared normal.      Multiple diverticula were found in the sigmoid colon.      External hemorrhoids were found during retroflexion. The hemorrhoids       were medium-sized.      Anal papilla(e) were hypertrophied. Impression:               - The examined portion of the ileum was normal.                           - Diverticulosis in the sigmoid colon.                           -  External hemorrhoids.                           - Anal papilla(e) were hypertrophied.                           - No specimens collected. Moderate Sedation:      Per Anesthesia Care Recommendation:           - Patient has a contact number available for                            emergencies. The signs and symptoms of potential                            delayed complications were discussed with the                            patient. Return to normal activities tomorrow.                            Written discharge instructions were provided to the                            patient.                           - High fiber diet  today.                           - Continue present medications.                           - Repeat colonoscopy in 7 years for surveillance. Procedure Code(s):        --- Professional ---                           [phone], Colonoscopy, flexible; diagnostic, including                            collection of specimen(s) by brushing or washing,                            when performed (separate procedure) Diagnosis Code(s):        --- Professional ---                           K64.4, Residual hemorrhoidal skin tags                           K62.89, Other specified diseases of anus and rectum                           R19.5, Other fecal abnormalities                           K57.30, Diverticulosis of large intestine without  perforation or abscess without bleeding CPT copyright 2019 American Medical Association. All rights reserved. The codes documented in this report are preliminary and upon coder review may  be revised to meet current compliance requirements. Hildred Laser, MD Hildred Laser, MD 03/02/2019 11:11:48 AM This report has been signed electronically. Number of Addenda: 0

## 2019-03-02 NOTE — Transfer of Care (Signed)
Immediate Anesthesia Transfer of Care Note  Patient: Dorothy Lawson  Procedure(s) Performed: COLONOSCOPY WITH PROPOFOL (N/A ) ESOPHAGOGASTRODUODENOSCOPY (EGD) /ESOPHAGEAL DILATION WITH PROPOFOL (N/A ) BIOPSY POLYPECTOMY  Patient Location: PACU  Anesthesia Type:MAC  Level of Consciousness: awake, alert , oriented and patient cooperative  Airway & Oxygen Therapy: Patient Spontanous Breathing  Post-op Assessment: Report given to RN and Post -op Vital signs reviewed and stable  Post vital signs: Reviewed and stable  Last Vitals:  Vitals Value Taken Time  BP    Temp    Pulse 63 03/02/19 1100  Resp 13 03/02/19 1100  SpO2 98 % 03/02/19 1100  Vitals shown include unvalidated device data.  Last Pain:  Vitals:   03/02/19 1018  TempSrc:   PainSc: 0-No pain      Patients Stated Pain Goal: 8 (06/13/00 1117)  Complications: No apparent anesthesia complications

## 2019-03-02 NOTE — Anesthesia Postprocedure Evaluation (Signed)
Anesthesia Post Note  Patient: Dorothy Lawson  Procedure(s) Performed: COLONOSCOPY WITH PROPOFOL (N/A ) ESOPHAGOGASTRODUODENOSCOPY (EGD) /ESOPHAGEAL DILATION WITH PROPOFOL (N/A ) BIOPSY POLYPECTOMY  Patient location during evaluation: PACU Anesthesia Type: MAC Level of consciousness: awake, awake and alert, patient cooperative and oriented Pain management: pain level controlled Vital Signs Assessment: post-procedure vital signs reviewed and stable Respiratory status: spontaneous breathing, respiratory function stable and nonlabored ventilation Cardiovascular status: stable Postop Assessment: no apparent nausea or vomiting Anesthetic complications: no     Last Vitals:  Vitals:   03/02/19 0915  BP: 140/84  Pulse: 72  Resp: 18  Temp: 37.2 C  SpO2: 98%    Last Pain:  Vitals:   03/02/19 1018  TempSrc:   PainSc: 0-No pain                 Luvinia Lucy

## 2019-03-02 NOTE — Discharge Instructions (Signed)
Resume aspirin on 03/03/2019. Take famotidine at bedtime on as-needed basis. Resume other medications as before. Pantoprazole 40 mg by mouth 30 minutes before breakfast daily. High-fiber diet. No driving for 24 hours. Physician will call with biopsy results.      Upper Endoscopy, Adult, Care After This sheet gives you information about how to care for yourself after your procedure. Your health care provider may also give you more specific instructions. If you have problems or questions, contact your health care provider. What can I expect after the procedure? After the procedure, it is common to have:  A sore throat.  Mild stomach pain or discomfort.  Bloating.  Nausea. Follow these instructions at home:   Follow instructions from your health care provider about what to eat or drink after your procedure.  Return to your normal activities as told by your health care provider. Ask your health care provider what activities are safe for you.  Take over-the-counter and prescription medicines only as told by your health care provider.  Do not drive for 24 hours if you were given a sedative during your procedure.  Keep all follow-up visits as told by your health care provider. This is important. Contact a health care provider if you have:  A sore throat that lasts longer than one day.  Trouble swallowing. Get help right away if:  You vomit blood or your vomit looks like coffee grounds.  You have: ? A fever. ? Bloody, black, or tarry stools. ? A severe sore throat or you cannot swallow. ? Difficulty breathing. ? Severe pain in your chest or abdomen. Summary  After the procedure, it is common to have a sore throat, mild stomach discomfort, bloating, and nausea.  Do not drive for 24 hours if you were given a sedative during the procedure.  Follow instructions from your health care provider about what to eat or drink after your procedure.  Return to your normal  activities as told by your health care provider. This information is not intended to replace advice given to you by your health care provider. Make sure you discuss any questions you have with your health care provider. Document Revised: 07/14/2017 Document Reviewed: 06/22/2017 Elsevier Patient Education  [place].     Colonoscopy, Adult, Care After This sheet gives you information about how to care for yourself after your procedure. Your doctor may also give you more specific instructions. If you have problems or questions, call your doctor. What can I expect after the procedure? After the procedure, it is common to have:  A small amount of blood in your poop (stool) for 24 hours.  Some gas.  Mild cramping or bloating in your belly (abdomen). Follow these instructions at home: Eating and drinking   Drink enough fluid to keep your pee (urine) pale yellow.  Follow instructions from your doctor about what you cannot eat or drink.  Return to your normal diet as told by your doctor. Avoid heavy or fried foods that are hard to digest. Activity  Rest as told by your doctor.  Do not sit for a long time without moving. Get up to take short walks every 1-2 hours. This is important. Ask for help if you feel weak or unsteady.  Return to your normal activities as told by your doctor. Ask your doctor what activities are safe for you. To help cramping and bloating:   Try walking around.  Put heat on your belly as told by your doctor. Use the heat  source that your doctor recommends, such as a moist heat pack or a heating pad. ? Put a towel between your skin and the heat source. ? Leave the heat on for 20-30 minutes. ? Remove the heat if your skin turns bright red. This is very important if you are unable to feel pain, heat, or cold. You may have a greater risk of getting burned. General instructions  For the first 24 hours after the procedure: ? Do not drive or use  machinery. ? Do not sign important documents. ? Do not drink alcohol. ? Do your daily activities more slowly than normal. ? Eat foods that are soft and easy to digest.  Take over-the-counter or prescription medicines only as told by your doctor.  Keep all follow-up visits as told by your doctor. This is important. Contact a doctor if:  You have blood in your poop 2-3 days after the procedure. Get help right away if:  You have more than a small amount of blood in your poop.  You see large clumps of tissue (blood clots) in your poop.  Your belly is swollen.  You feel like you may vomit (nauseous).  You vomit.  You have a fever.  You have belly pain that gets worse, and medicine does not help your pain. Summary  After the procedure, it is common to have a small amount of blood in your poop. You may also have mild cramping and bloating in your belly.  For the first 24 hours after the procedure, do not drive or use machinery, do not sign important documents, and do not drink alcohol.  Get help right away if you have a lot of blood in your poop, feel like you may vomit, have a fever, or have more belly pain. This information is not intended to replace advice given to you by your health care provider. Make sure you discuss any questions you have with your health care provider. Document Revised: 08/16/2018 Document Reviewed: 08/16/2018 Elsevier Patient Education  [place].     High-Fiber Diet Fiber, also called dietary fiber, is a type of carbohydrate that is found in fruits, vegetables, whole grains, and beans. A high-fiber diet can have many health benefits. Your health care provider may recommend a high-fiber diet to help:  Prevent constipation. Fiber can make your bowel movements more regular.  Lower your cholesterol.  Relieve the following conditions: ? Swelling of veins in the anus (hemorrhoids). ? Swelling and irritation (inflammation) of specific areas of  the digestive tract (uncomplicated diverticulosis). ? A problem of the large intestine (colon) that sometimes causes pain and diarrhea (irritable bowel syndrome, IBS).  Prevent overeating as part of a weight-loss plan.  Prevent heart disease, type 2 diabetes, and certain cancers. What is my plan? The recommended daily fiber intake in grams (g) includes:  38 g for men age 17 or younger.  30 g for men over age 67.  44 g for women age 28 or younger.  21 g for women over age 58. You can get the recommended daily intake of dietary fiber by:  Eating a variety of fruits, vegetables, grains, and beans.  Taking a fiber supplement, if it is not possible to get enough fiber through your diet. What do I need to know about a high-fiber diet?  It is better to get fiber through food sources rather than from fiber supplements. There is not a lot of research about how effective supplements are.  Always check the fiber content  on the nutrition facts label of any prepackaged food. Look for foods that contain 5 g of fiber or more per serving.  Talk with a diet and nutrition specialist (dietitian) if you have questions about specific foods that are recommended or not recommended for your medical condition, especially if those foods are not listed below.  Gradually increase how much fiber you consume. If you increase your intake of dietary fiber too quickly, you may have bloating, cramping, or gas.  Drink plenty of water. Water helps you to digest fiber. What are tips for following this plan?  Eat a wide variety of high-fiber foods.  Make sure that half of the grains that you eat each day are whole grains.  Eat breads and cereals that are made with whole-grain flour instead of refined flour or white flour.  Eat brown rice, bulgur wheat, or millet instead of white rice.  Start the day with a breakfast that is high in fiber, such as a cereal that contains 5 g of fiber or more per serving.  Use  beans in place of meat in soups, salads, and pasta dishes.  Eat high-fiber snacks, such as berries, raw vegetables, nuts, and popcorn.  Choose whole fruits and vegetables instead of processed forms like juice or sauce. What foods can I eat?  Fruits Berries. Pears. Apples. Oranges. Avocado. Prunes and raisins. Dried figs. Vegetables Sweet potatoes. Spinach. Kale. Artichokes. Cabbage. Broccoli. Cauliflower. Green peas. Carrots. Squash. Grains Whole-grain breads. Multigrain cereal. Oats and oatmeal. Brown rice. Barley. Bulgur wheat. Mulberry. Quinoa. Bran muffins. Popcorn. Rye wafer crackers. Meats and other proteins Navy, kidney, and pinto beans. Soybeans. Split peas. Lentils. Nuts and seeds. Dairy Fiber-fortified yogurt. Beverages Fiber-fortified soy milk. Fiber-fortified orange juice. Other foods Fiber bars. The items listed above may not be a complete list of recommended foods and beverages. Contact a dietitian for more options. What foods are not recommended? Fruits Fruit juice. Cooked, strained fruit. Vegetables Fried potatoes. Canned vegetables. Well-cooked vegetables. Grains White bread. Pasta made with refined flour. White rice. Meats and other proteins Fatty cuts of meat. Fried chicken or fried fish. Dairy Milk. Yogurt. Cream cheese. Sour cream. Fats and oils Butters. Beverages Soft drinks. Other foods Cakes and pastries. The items listed above may not be a complete list of foods and beverages to avoid. Contact a dietitian for more information. Summary  Fiber is a type of carbohydrate. It is found in fruits, vegetables, whole grains, and beans.  There are many health benefits of eating a high-fiber diet, such as preventing constipation, lowering blood cholesterol, helping with weight loss, and reducing your risk of heart disease, diabetes, and certain cancers.  Gradually increase your intake of fiber. Increasing too fast can result in cramping, bloating, and gas.  Drink plenty of water while you increase your fiber.  The best sources of fiber include whole fruits and vegetables, whole grains, nuts, seeds, and beans. This information is not intended to replace advice given to you by your health care provider. Make sure you discuss any questions you have with your health care provider. Document Revised: 11/24/2016 Document Reviewed: 11/24/2016 Elsevier Patient Education  2020 [place] After These instructions provide you with information about caring for yourself after your procedure. Your health care provider may also give you more specific instructions. Your treatment has been planned according to current medical practices, but problems sometimes occur. Call your health care provider if you have any problems or questions after  your procedure. What can I expect after the procedure? After your procedure, you may:  Feel sleepy for several hours.  Feel clumsy and have poor balance for several hours.  Feel forgetful about what happened after the procedure.  Have poor judgment for several hours.  Feel nauseous or vomit.  Have a sore throat if you had a breathing tube during the procedure. Follow these instructions at home: For at least 24 hours after the procedure:      Have a responsible adult stay with you. It is important to have someone help care for you until you are awake and alert.  Rest as needed.  Do not: ? Participate in activities in which you could fall or become injured. ? Drive. ? Use heavy machinery. ? Drink alcohol. ? Take sleeping pills or medicines that cause drowsiness. ? Make important decisions or sign legal documents. ? Take care of children on your own. Eating and drinking  Follow the diet that is recommended by your health care provider.  If you vomit, drink water, juice, or soup when you can drink without vomiting.  Make sure you have little or no nausea before  eating solid foods. General instructions  Take over-the-counter and prescription medicines only as told by your health care provider.  If you have sleep apnea, surgery and certain medicines can increase your risk for breathing problems. Follow instructions from your health care provider about wearing your sleep device: ? Anytime you are sleeping, including during daytime naps. ? While taking prescription pain medicines, sleeping medicines, or medicines that make you drowsy.  If you smoke, do not smoke without supervision.  Keep all follow-up visits as told by your health care provider. This is important. Contact a health care provider if:  You keep feeling nauseous or you keep vomiting.  You feel light-headed.  You develop a rash.  You have a fever. Get help right away if:  You have trouble breathing. Summary  For several hours after your procedure, you may feel sleepy and have poor judgment.  Have a responsible adult stay with you for at least 24 hours or until you are awake and alert. This information is not intended to replace advice given to you by your health care provider. Make sure you discuss any questions you have with your health care provider. Document Revised: 04/20/2017 Document Reviewed: 05/13/2015 Elsevier Patient Education  [place].    Colon Polyps  Polyps are tissue growths inside the body. Polyps can grow in many places, including the large intestine (colon). A polyp may be a round bump or a mushroom-shaped growth. You could have one polyp or several. Most colon polyps are noncancerous (benign). However, some colon polyps can become cancerous over time. Finding and removing the polyps early can help prevent this. What are the causes? The exact cause of colon polyps is not known. What increases the risk? You are more likely to develop this condition if you:  Have a family history of colon cancer or colon polyps.  Are older than 73 or older  than 45 if you are African American.  Have inflammatory bowel disease, such as ulcerative colitis or Crohn's disease.  Have certain hereditary conditions, such as: ? Familial adenomatous polyposis. ? Lynch syndrome. ? Turcot syndrome. ? Peutz-Jeghers syndrome.  Are overweight.  Smoke cigarettes.  Do not get enough exercise.  Drink too much alcohol.  Eat a diet that is high in fat and red meat and low in fiber.  Had childhood  cancer that was treated with abdominal radiation. What are the signs or symptoms? Most polyps do not cause symptoms. If you have symptoms, they may include:  Blood coming from your rectum when having a bowel movement.  Blood in your stool. The stool may look dark red or black.  Abdominal pain.  A change in bowel habits, such as constipation or diarrhea. How is this diagnosed? This condition is diagnosed with a colonoscopy. This is a procedure in which a lighted, flexible scope is inserted into the anus and then passed into the colon to examine the area. Polyps are sometimes found when a colonoscopy is done as part of routine cancer screening tests. How is this treated? Treatment for this condition involves removing any polyps that are found. Most polyps can be removed during a colonoscopy. Those polyps will then be tested for cancer. Additional treatment may be needed depending on the results of testing. Follow these instructions at home: Lifestyle  Maintain a healthy weight, or lose weight if recommended by your health care provider.  Exercise every day or as told by your health care provider.  Do not use any products that contain nicotine or tobacco, such as cigarettes and e-cigarettes. If you need help quitting, ask your health care provider.  If you drink alcohol, limit how much you have: ? 0-1 drink a day for women. ? 0-2 drinks a day for men.  Be aware of how much alcohol is in your drink. In the [place] one drink equals one 12 oz bottle of  beer (355 mL), one 5 oz glass of wine (148 mL), or one 1 oz shot of hard liquor (44 mL). Eating and drinking   Eat foods that are high in fiber, such as fruits, vegetables, and whole grains.  Eat foods that are high in calcium and vitamin D, such as milk, cheese, yogurt, eggs, liver, fish, and broccoli.  Limit foods that are high in fat, such as fried foods and desserts.  Limit the amount of red meat and processed meat you eat, such as hot dogs, sausage, bacon, and lunch meats. General instructions  Keep all follow-up visits as told by your health care provider. This is important. ? This includes having regularly scheduled colonoscopies. ? Talk to your health care provider about when you need a colonoscopy. Contact a health care provider if:  You have new or worsening bleeding during a bowel movement.  You have new or increased blood in your stool.  You have a change in bowel habits.  You lose weight for no known reason. Summary  Polyps are tissue growths inside the body. Polyps can grow in many places, including the colon.  Most colon polyps are noncancerous (benign), but some can become cancerous over time.  This condition is diagnosed with a colonoscopy.  Treatment for this condition involves removing any polyps that are found. Most polyps can be removed during a colonoscopy. This information is not intended to replace advice given to you by your health care provider. Make sure you discuss any questions you have with your health care provider. Document Revised: 05/07/2017 Document Reviewed: 05/07/2017 Elsevier Patient Education  [place].

## 2019-03-03 ENCOUNTER — Other Ambulatory Visit: Payer: Self-pay

## 2019-03-03 LAB — SURGICAL PATHOLOGY

## 2019-07-07 ENCOUNTER — Encounter (INDEPENDENT_AMBULATORY_CARE_PROVIDER_SITE_OTHER): Payer: Self-pay

## 2019-08-18 DIAGNOSIS — E785 Hyperlipidemia, unspecified: Secondary | ICD-10-CM | POA: Diagnosis not present

## 2019-08-18 DIAGNOSIS — K219 Gastro-esophageal reflux disease without esophagitis: Secondary | ICD-10-CM | POA: Diagnosis not present

## 2019-08-18 DIAGNOSIS — Z7982 Long term (current) use of aspirin: Secondary | ICD-10-CM | POA: Diagnosis not present

## 2019-08-18 DIAGNOSIS — Z881 Allergy status to other antibiotic agents status: Secondary | ICD-10-CM | POA: Diagnosis not present

## 2019-08-18 DIAGNOSIS — Z803 Family history of malignant neoplasm of breast: Secondary | ICD-10-CM | POA: Diagnosis not present

## 2019-08-18 DIAGNOSIS — Z8249 Family history of ischemic heart disease and other diseases of the circulatory system: Secondary | ICD-10-CM | POA: Diagnosis not present

## 2019-08-18 DIAGNOSIS — Z880 Allergy status to penicillin: Secondary | ICD-10-CM | POA: Diagnosis not present

## 2019-08-18 DIAGNOSIS — I10 Essential (primary) hypertension: Secondary | ICD-10-CM | POA: Diagnosis not present

## 2019-09-05 ENCOUNTER — Other Ambulatory Visit: Payer: Self-pay

## 2019-09-05 ENCOUNTER — Ambulatory Visit (INDEPENDENT_AMBULATORY_CARE_PROVIDER_SITE_OTHER): Payer: Medicare PPO | Admitting: Gastroenterology

## 2019-09-05 ENCOUNTER — Encounter (INDEPENDENT_AMBULATORY_CARE_PROVIDER_SITE_OTHER): Payer: Self-pay | Admitting: Gastroenterology

## 2019-09-05 VITALS — BP 124/79 | HR 55 | Temp 98.6°F | Resp 14 | Ht 65.0 in | Wt 181.0 lb

## 2019-09-05 DIAGNOSIS — Z86010 Personal history of colonic polyps: Secondary | ICD-10-CM | POA: Diagnosis not present

## 2019-09-05 DIAGNOSIS — K219 Gastro-esophageal reflux disease without esophagitis: Secondary | ICD-10-CM | POA: Diagnosis not present

## 2019-09-05 DIAGNOSIS — R1310 Dysphagia, unspecified: Secondary | ICD-10-CM

## 2019-09-05 MED ORDER — PANTOPRAZOLE SODIUM 40 MG PO TBEC: 40.0000 mg | DELAYED_RELEASE_TABLET | Freq: Every day | ORAL | 3 refills | Status: DC

## 2019-09-05 NOTE — Patient Instructions (Addendum)
GERD instructions: -Please avoid lying flat within 2 to 3 hours of eating, this will make reflux symptoms worse. -Some patients find elevating the head of the bed beneficial. -Avoid spicy greasy foods as well as caffeine, coffee, sodas-these food/drinks can worsen heartburn and reflux. -Tobacco will worsen reflux, please try to decrease/eliminate tobacco intake if applicable. -Avoid NSAID products (ibuprofen, aspirin, Advil, Aleve, Goody's, BCs, Alka-Seltzer) - if needing these occasionally please try to take with meal or snack to decrease stomach irritation. -If taking medication for reflux such as prilosec, nexium, aciphex, dexilant, prevacid - take 20-30 minutes before a meal for maximum effectiveness.    Continue protonix 40mg  once a day

## 2019-09-05 NOTE — Progress Notes (Signed)
Patient profile: Dorothy Lawson is a 69 y.o. female seen for follow up of GERD. Last seen 01/26/20 for positive occult blood - she had EGD/colonoscopy as below.   History of Present Illness: Dorothy Lawson is seen today for f/up of GERD & dysphagia.  She reports her dysphagia resolving since her dilation in January 2021.  She is no longer taking Pepcid at night.  She does continue Protonix each morning.  She remains upright after meals and usually eats dinner at 4 PM.  She has rare GERD symptoms if she eats something fatty liver and keeps times with her which resolves the symptoms.  She denies any nausea vomiting epigastric pain.  She is not taking NSAIDs frequently.  She is not having any lower GI symptoms.  Having regular bowel movements without constipation or diarrhea.  Does take fiber daily.  Denies abdominal pain.  She stays active helping with 2 grandchildren.  She does have significant knee and leg cramping intermittently.  She has had both knees replaced.  Wt Readings from Last 3 Encounters:  09/05/19 181 lb (82.1 kg)  02/28/19 182 lb (82.6 kg)  01/26/19 182 lb 3.2 oz (82.6 kg)     Last Colonoscopy: 02/2019-- The examined portion of the ileum was normal. - Diverticulosis in the sigmoid colon.  - External hemorrhoids. - Anal papilla(e) were hypertrophied.  - No specimens collected 7 year repeat recommended    Last Endoscopy: 02/2019-- Esophageal polyp was found. Ablated via cold biopsy.  - LA Grade A reflux esophagitis with no bleeding.  - 3 cm hiatal hernia. - No endoscopic esophageal abnormality to explain  patient's dysphagia. Esophagus dilated prior to biopsy. Dilated.  - Gastritis. Biopsied.  - Normal duodenal bulb and second portion of the duodenum.  A. STOMACH, BIOPSY:  - Reactive gastropathy.  - Warthin-Starry is negative for Helicobacter pylori.  - No intestinal metaplasia, dysplasia, or malignancy.   B. ESOPHAGUS, POLYPECTOMY:  - Benign squamous  papilloma.  Past Medical History:  Past Medical History:  Diagnosis Date  . Arthritis 05-03-12   osteoarthritis - knees  . GERD (gastroesophageal reflux disease)    tx. Prilosec  . Hypercholesterolemia 04-15-12   tx. meds  . Hypertension     Problem List: Patient Active Problem List   Diagnosis Date Noted  . Other dysphagia 02/15/2019  . Occult blood positive stool 02/02/2019  . Personal history of colonic polyps 02/02/2019  . Long term current use of anticoagulant therapy 02/02/2019  . Postoperative anemia due to acute blood loss 05/11/2012  . Hyponatremia 05/11/2012  . OA (osteoarthritis) of knee 05/10/2012    Past Surgical History: Past Surgical History:  Procedure Laterality Date  . BIOPSY  03/02/2019   Procedure: BIOPSY;  Surgeon: Rogene Houston, MD;  [facility];  Service: Endoscopy;;  gastric  . COLONOSCOPY N/A 03/16/2014   Procedure: COLONOSCOPY;  Surgeon: Rogene Houston, MD;  [facility];  Service: Endoscopy;  Laterality: N/A;  1015 - moved to 2/11 @ 9:30  . COLONOSCOPY W/ POLYPECTOMY    . COLONOSCOPY WITH PROPOFOL N/A 03/02/2019   Procedure: COLONOSCOPY WITH PROPOFOL;  Surgeon: Rogene Houston, MD;  [facility];  Service: Endoscopy;  Laterality: N/A;  10:30  . ESOPHAGOGASTRODUODENOSCOPY (EGD) WITH PROPOFOL N/A 03/02/2019   Procedure: ESOPHAGOGASTRODUODENOSCOPY (EGD) /ESOPHAGEAL DILATION WITH PROPOFOL;  Surgeon: Rogene Houston, MD;  [facility];  Service: Endoscopy;  Laterality: N/A;  . POLYPECTOMY  03/02/2019   Procedure:  POLYPECTOMY;  Surgeon: Rogene Houston, MD;  [facility];  Service: Endoscopy;;  esophageal  . TOTAL KNEE ARTHROPLASTY Left 05/10/2012   Procedure: TOTAL KNEE ARTHROPLASTY;  Surgeon: [name], MD;  Location: [facility];  Service: Orthopedics;  Laterality: Left;  . TOTAL KNEE ARTHROPLASTY Right 05/19/2016   Procedure: RIGHT TOTAL KNEE ARTHROPLASTY;  Surgeon: Gaynelle Arabian, MD;  Location: [facility];  Service: Orthopedics;  Laterality: Right;  requests 24mins  . TUBAL LIGATION      Allergies: Allergies  Allergen Reactions  . Macrodantin [Nitrofurantoin] Other (See Comments)    Bumps in mouth   . Keflex [Cephalexin] Rash      Home Medications:  Current Outpatient Medications:  .  acetaminophen (TYLENOL) 500 MG tablet, Take 1,000 mg by mouth every 6 (six) hours as needed (for pain.)., Disp: , Rfl:  .  aspirin EC 81 MG tablet, Take 1 tablet (81 mg total) by mouth at bedtime., Disp:  , Rfl:  .  atorvastatin (LIPITOR) 10 MG tablet, Take 10 mg by mouth at bedtime. , Disp: , Rfl: 3 .  Calcium Carbonate-Vit D-Min (CALCIUM 1200 PO), Take 1 tablet by mouth 2 (two) times daily. , Disp: , Rfl:  .  Cholecalciferol (VITAMIN D3) 10 MCG (400 UNIT) CAPS, Take 1 tablet by mouth 2 (two) times daily. , Disp: , Rfl:  .  famotidine (PEPCID) 20 MG tablet, Take 1 tablet (20 mg total) by mouth at bedtime as needed for heartburn or indigestion., Disp: , Rfl:  .  Glucosamine-Chondroit-Vit C-Mn (GLUCOSAMINE 1500 COMPLEX) CAPS, Take 1 capsule by mouth daily. , Disp: , Rfl:  .  Magnesium 250 MG TABS, Take 250 mg by mouth daily. , Disp: , Rfl:  .  metoprolol (LOPRESSOR) 50 MG tablet, Take 50 mg by mouth 2 (two) times daily., Disp: , Rfl:  .  Multiple Vitamins-Minerals (CENTRUM SILVER 50+WOMEN PO), Take 1 tablet by mouth daily. , Disp: , Rfl:  .  pantoprazole (PROTONIX) 40 MG tablet, Take 1 tablet (40 mg total) by mouth daily before breakfast., Disp: 30 tablet, Rfl: 3 .  Propylene Glycol (SYSTANE COMPLETE OP), Place 1 drop into both eyes 2 (two) times daily., Disp: , Rfl:  .  psyllium (METAMUCIL) 58.6 % packet, Take 1 packet by mouth at bedtime., Disp: , Rfl:  .  triamterene-hydrochlorothiazide (MAXZIDE-25) 37.5-25 MG per tablet, Take 0.5 tablets by mouth daily. , Disp: , Rfl:    Family History: family history is not on file.    Social History:   reports that she has never smoked. She has never used  smokeless tobacco. She reports current alcohol use. She reports that she does not use drugs.   Review of Systems: Constitutional: Denies weight loss/weight gain  Eyes: No changes in vision. ENT: No oral lesions, sore throat.  GI: see HPI.  Heme/Lymph: No easy bruising.  CV: No chest pain.  GU: No hematuria.  Integumentary: No rashes.  Neuro: No headaches.  Psych: No depression/anxiety.  Endocrine: No heat/cold intolerance.  Allergic/Immunologic: No urticaria.  Resp: No cough, SOB.  Musculoskeletal: No joint swelling.    Physical Examination: BP 124/79   Pulse 55   Temp 98.6 F (37 C)   Resp 14   Ht 5\' 5"  (1.651 m)   Wt 181 lb (82.1 kg)   BMI 30.12 kg/m  Gen: NAD, alert and oriented x 4 HEENT: PEERLA, EOMI, Neck: supple, no JVD Chest: CTA bilaterally, no wheezes, crackles, or other adventitious sounds CV: RRR,  no m/g/c/r Abd: soft, NT, ND, +BS in all four quadrants; no HSM, guarding, ridigity, or rebound tenderness Ext: no edema, well perfused with 2+ pulses, Skin: no rash or lesions noted on observed skin Lymph: no noted LAD  Data Reviewed:     Assessment/Plan: Ms. Patient is a 69 y.o. female    Rayette was seen today for follow-up.  Diagnoses and all orders for this visit:  Chronic GERD  Personal history of colonic polyps  Dysphagia, unspecified type    1.  GERD-Well-controlled on Protonix daily which she will continue.  Diet modifications reviewed.  She has no upper GI alarm symptoms.  Endoscopy in January improved her dysphagia.  Will notify me if recurs.  2.  History of colon polyps-also reports significant history of multiple colon polyps in her sister.  Colonoscopy January 2021 without any polyps removed.  She prefers to have colonoscopies every 5 years instead of 7 years.  If she is doing well her PCP will refill her PPI.  She is to contact us with any new or changes in her GI symptoms, otherwise she would like to follow-up as needed   I  personally performed the service, non-incident to. (WP)  Laurine Blazer, [facility]

## 2019-11-01 DIAGNOSIS — Z1339 Encounter for screening examination for other mental health and behavioral disorders: Secondary | ICD-10-CM | POA: Diagnosis not present

## 2019-11-01 DIAGNOSIS — I10 Essential (primary) hypertension: Secondary | ICD-10-CM | POA: Diagnosis not present

## 2019-11-01 DIAGNOSIS — Z299 Encounter for prophylactic measures, unspecified: Secondary | ICD-10-CM | POA: Diagnosis not present

## 2019-11-01 DIAGNOSIS — R5383 Other fatigue: Secondary | ICD-10-CM | POA: Diagnosis not present

## 2019-11-01 DIAGNOSIS — Z1331 Encounter for screening for depression: Secondary | ICD-10-CM | POA: Diagnosis not present

## 2019-11-01 DIAGNOSIS — Z0000 Encounter for general adult medical examination without abnormal findings: Secondary | ICD-10-CM | POA: Diagnosis not present

## 2019-11-01 DIAGNOSIS — E7800 Pure hypercholesterolemia, unspecified: Secondary | ICD-10-CM | POA: Diagnosis not present

## 2019-11-01 DIAGNOSIS — Z7189 Other specified counseling: Secondary | ICD-10-CM | POA: Diagnosis not present

## 2019-11-02 DIAGNOSIS — Z79899 Other long term (current) drug therapy: Secondary | ICD-10-CM | POA: Diagnosis not present

## 2019-11-02 DIAGNOSIS — R5383 Other fatigue: Secondary | ICD-10-CM | POA: Diagnosis not present

## 2019-11-02 DIAGNOSIS — E7800 Pure hypercholesterolemia, unspecified: Secondary | ICD-10-CM | POA: Diagnosis not present

## 2019-11-07 ENCOUNTER — Ambulatory Visit (INDEPENDENT_AMBULATORY_CARE_PROVIDER_SITE_OTHER): Payer: Medicare PPO | Admitting: Dermatology

## 2019-11-07 ENCOUNTER — Encounter: Payer: Self-pay | Admitting: Dermatology

## 2019-11-07 ENCOUNTER — Other Ambulatory Visit: Payer: Self-pay

## 2019-11-07 DIAGNOSIS — L603 Nail dystrophy: Secondary | ICD-10-CM

## 2019-11-07 DIAGNOSIS — L821 Other seborrheic keratosis: Secondary | ICD-10-CM

## 2019-11-07 DIAGNOSIS — Z1283 Encounter for screening for malignant neoplasm of skin: Secondary | ICD-10-CM

## 2019-11-07 NOTE — Patient Instructions (Addendum)
Follow-up visit for Dorothy Lawson date of birth 03-Dec-1950.  Several skin issues addressed.  We discussed her thickened heaped up left second toenail, a so-called pincer nail.  She saw a foot doctor who suggested this was fungal.  There may be a little bone spur in the distal toe bone but the key factor would be pain and right now there is more trouble cutting the nail than actual pain.  I would be against taking oral antifungals and topicals do not work.  If she were to develop a painful nail, I would prefer having the nail plate permanently removed rather than medical therapy.  We did general skin check and Seleny does have a remarkable number of benign keratoses.  On the legs more than the arms these are flat tan textured spots.  One on the left triceps has a little projection which may be a wart.  The spot that spontaneously came off the right back cheek, perhaps after minor trauma, fits the natural history of benign keratoses.  Currently she has no atypical moles, melanoma, or nonmole skin cancer and her many keratoses do not predispose her to this.  She should check her own skin twice annually (her husband can check her back) and optionally have a general skin check by a dermatologist yearly.

## 2019-11-25 DIAGNOSIS — E2839 Other primary ovarian failure: Secondary | ICD-10-CM | POA: Diagnosis not present

## 2019-12-08 DIAGNOSIS — Z1231 Encounter for screening mammogram for malignant neoplasm of breast: Secondary | ICD-10-CM | POA: Diagnosis not present

## 2019-12-11 ENCOUNTER — Encounter: Payer: Self-pay | Admitting: Dermatology

## 2019-12-11 NOTE — Progress Notes (Signed)
° °  Follow-Up Visit   Subjective  Dorothy Lawson is a 69 y.o. female who presents for the following: Actinic Keratosis (right side burn peeled off but some dark still present patient has list ).  General skin check Location:  Duration:  Quality:  Associated Signs/Symptoms: Modifying Factors:  Severity:  Timing: Context:   Objective  Well appearing patient in no apparent distress; mood and affect are within normal limits.  All sun exposed areas plus back examined.   Assessment & Plan    Onychodystrophy Left Hallux Toe Nail Plate  If develops pain encouraged to return to her podiatrist for examination for possible bone spur.  Seborrheic keratosis (2) Left Upper Arm - Posterior; Mid Back  Leave if stable  Encounter for screening for malignant neoplasm of skin Mid Back   Follow-up visit for Dorothy Lawson date of birth 06-Jun-1950.  Several skin issues addressed.  We discussed her thickened heaped up left second toenail, a so-called pincer nail.  She saw a foot doctor who suggested this was fungal.  There may be a little bone spur in the distal toe bone but the key factor would be pain and right now there is more trouble cutting the nail than actual pain.  I would be against taking oral antifungals and topicals do not work.  If she were to develop a painful nail, I would prefer having the nail plate permanently removed rather than medical therapy.  We did general skin check and Happy does have a remarkable number of benign keratoses.  On the legs more than the arms these are flat tan textured spots.  One on the left triceps has a little projection which may be a wart.  The spot that spontaneously came off the right back cheek, perhaps after minor trauma, fits the natural history of benign keratoses.  Currently she has no atypical moles, melanoma, or nonmole skin cancer and her many keratoses do not predispose her to this.  She should check her own skin twice annually (her  husband can check her back) and optionally have a general skin check by a dermatologist yearly.  I, Lavonna Monarch, MD, have reviewed all documentation for this visit.  The documentation on 12/11/19 for the exam, diagnosis, procedures, and orders are all accurate and complete.

## 2020-03-14 DIAGNOSIS — Z299 Encounter for prophylactic measures, unspecified: Secondary | ICD-10-CM | POA: Diagnosis not present

## 2020-03-14 DIAGNOSIS — J069 Acute upper respiratory infection, unspecified: Secondary | ICD-10-CM | POA: Diagnosis not present

## 2020-03-14 DIAGNOSIS — Z20822 Contact with and (suspected) exposure to covid-19: Secondary | ICD-10-CM | POA: Diagnosis not present

## 2020-03-19 DIAGNOSIS — U071 COVID-19: Secondary | ICD-10-CM | POA: Diagnosis not present

## 2020-03-19 DIAGNOSIS — Z299 Encounter for prophylactic measures, unspecified: Secondary | ICD-10-CM | POA: Diagnosis not present

## 2020-04-04 DIAGNOSIS — H3540 Unspecified peripheral retinal degeneration: Secondary | ICD-10-CM | POA: Diagnosis not present

## 2020-05-10 DIAGNOSIS — M25551 Pain in right hip: Secondary | ICD-10-CM | POA: Diagnosis not present

## 2020-05-10 DIAGNOSIS — Z96653 Presence of artificial knee joint, bilateral: Secondary | ICD-10-CM | POA: Diagnosis not present

## 2020-05-10 DIAGNOSIS — M25552 Pain in left hip: Secondary | ICD-10-CM | POA: Diagnosis not present

## 2020-09-20 ENCOUNTER — Encounter (INDEPENDENT_AMBULATORY_CARE_PROVIDER_SITE_OTHER): Payer: Self-pay | Admitting: Gastroenterology

## 2020-09-20 ENCOUNTER — Other Ambulatory Visit: Payer: Self-pay

## 2020-09-20 ENCOUNTER — Ambulatory Visit (INDEPENDENT_AMBULATORY_CARE_PROVIDER_SITE_OTHER): Payer: Medicare PPO | Admitting: Gastroenterology

## 2020-09-20 VITALS — BP 135/80 | HR 65 | Temp 98.2°F | Ht 65.0 in | Wt 182.2 lb

## 2020-09-20 DIAGNOSIS — K219 Gastro-esophageal reflux disease without esophagitis: Secondary | ICD-10-CM

## 2020-09-20 DIAGNOSIS — R1319 Other dysphagia: Secondary | ICD-10-CM

## 2020-09-20 MED ORDER — PANTOPRAZOLE SODIUM 20 MG PO TBEC: 20.0000 mg | DELAYED_RELEASE_TABLET | Freq: Every day | ORAL | 3 refills | Status: DC

## 2020-09-20 NOTE — Progress Notes (Signed)
[name], M.D. Gastroenterology & Hepatology [facility] [address] [place], [place] [address]  Primary Care Physician: Glenda Chroman, MD [place] [address]  I will communicate my assessment and recommendations to the referring MD via EMR.  Problems: GERD  History of Present Illness: Dorothy Lawson is a 70 y.o. female with past medical history of GERD, hyperlipidemia, hypertension and arthritis, who presents for follow up of GERD.  The patient was last seen on 09/05/2019. At that time, the patient was advised to continue taking pantoprazole 40 mg every day as she had adequate control of her symptoms.  Patient reports that she takes pantoprazole 40 mg every day for her GERD. Takes the medication 30 mintues before breakfast.  Denies having any heartburn or regurgitation as long as she takes the PPI compliantly. Occasionally has some dysphagia but this is very rare. No odynophagia.  Denies having any complaints at the moment. The patient denies having any nausea, vomiting, fever, chills, hematochezia, melena, hematemesis, abdominal distention, abdominal pain, diarrhea, jaundice, pruritus or weight loss.  Last EGD: 02/2019-- Esophageal polyp was found. Ablated via cold biopsy.  - LA Grade A reflux esophagitis with no bleeding.  - 3 cm hiatal hernia. - No endoscopic esophageal abnormality to explain  patient's dysphagia. Esophagus dilated prior to biopsy. Dilated.  - Gastritis. Biopsied.  - Normal duodenal bulb and second portion of the duodenum.   A. STOMACH, BIOPSY:  - Reactive gastropathy.  - Warthin-Starry is negative for Helicobacter pylori.  - No intestinal metaplasia, dysplasia, or malignancy.   B. ESOPHAGUS, POLYPECTOMY:  - Benign squamous papilloma.  Last Colonoscopy: 02/2019-- The examined portion of the ileum was normal. - Diverticulosis in the sigmoid colon.  - External hemorrhoids. - Anal  papilla(e) were hypertrophied.  - No specimens collected 7 year repeat recommended   Past Medical History: Past Medical History:  Diagnosis Date   Arthritis 05-03-12   osteoarthritis - knees   GERD (gastroesophageal reflux disease)    tx. Prilosec   Hypercholesterolemia 04-15-12   tx. meds   Hypertension     Past Surgical History: Past Surgical History:  Procedure Laterality Date   BIOPSY  03/02/2019   Procedure: BIOPSY;  Surgeon: Rogene Houston, MD;  [facility];  Service: Endoscopy;;  gastric   COLONOSCOPY N/A 03/16/2014   Procedure: COLONOSCOPY;  Surgeon: Rogene Houston, MD;  [facility];  Service: Endoscopy;  Laterality: N/A;  1015 - moved to 2/11 @ 9:30   COLONOSCOPY W/ POLYPECTOMY     COLONOSCOPY WITH PROPOFOL N/A 03/02/2019   Procedure: COLONOSCOPY WITH PROPOFOL;  Surgeon: Rogene Houston, MD;  [facility];  Service: Endoscopy;  Laterality: N/A;  10:30   ESOPHAGOGASTRODUODENOSCOPY (EGD) WITH PROPOFOL N/A 03/02/2019   Procedure: ESOPHAGOGASTRODUODENOSCOPY (EGD) /ESOPHAGEAL DILATION WITH PROPOFOL;  Surgeon: Rogene Houston, MD;  [facility];  Service: Endoscopy;  Laterality: N/A;   POLYPECTOMY  03/02/2019   Procedure: POLYPECTOMY;  Surgeon: Rogene Houston, MD;  [facility];  Service: Endoscopy;;  esophageal   TOTAL KNEE ARTHROPLASTY Left 05/10/2012   Procedure: TOTAL KNEE ARTHROPLASTY;  Surgeon: [name], MD;  [facility]: [facility];  Service: Orthopedics;  Laterality: Left;   TOTAL KNEE ARTHROPLASTY Right 05/19/2016   Procedure: RIGHT TOTAL KNEE ARTHROPLASTY;  Surgeon: Gaynelle Arabian, MD;  Location: [facility];  Service: Orthopedics;  Laterality: Right;  requests 46mns   TUBAL LIGATION  Family History: Family History  Problem Relation Age of Onset   Hypertension Mother    Hypertension Father    Dementia Father     Social History: Social History   Tobacco Use  Smoking Status Never  Smokeless Tobacco Never    Social History   Substance and Sexual Activity  Alcohol Use Yes   Comment: rare-seldom   Social History   Substance and Sexual Activity  Drug Use No    Allergies: Allergies  Allergen Reactions   Macrodantin [Nitrofurantoin] Other (See Comments)    Bumps in mouth    Keflex [Cephalexin] Rash    Medications: Current Outpatient Medications  Medication Sig Dispense Refill   acetaminophen (TYLENOL) 500 MG tablet Take 1,000 mg by mouth every 6 (six) hours as needed (for pain.).     aspirin EC 81 MG tablet Take 1 tablet (81 mg total) by mouth at bedtime.     atorvastatin (LIPITOR) 10 MG tablet Take 10 mg by mouth at bedtime.   3   Calcium Carbonate-Vit D-Min (CALCIUM 1200 PO) Take 1 tablet by mouth 2 (two) times daily.      Cholecalciferol (VITAMIN D3) 10 MCG (400 UNIT) CAPS Take 1 tablet by mouth 2 (two) times daily.      Glucosamine-Chondroit-Vit C-Mn (GLUCOSAMINE 1500 COMPLEX) CAPS Take 1 capsule by mouth daily.      Magnesium 250 MG TABS Take 250 mg by mouth daily.      metoprolol (LOPRESSOR) 50 MG tablet Take 50 mg by mouth 2 (two) times daily.     Multiple Vitamins-Minerals (CENTRUM SILVER 50+WOMEN PO) Take 1 tablet by mouth daily.      pantoprazole (PROTONIX) 40 MG tablet Take 1 tablet (40 mg total) by mouth daily before breakfast. 90 tablet 3   Propylene Glycol (SYSTANE COMPLETE OP) Place 1 drop into both eyes 2 (two) times daily.     psyllium (METAMUCIL) 58.6 % packet Take 1 packet by mouth at bedtime.     triamterene-hydrochlorothiazide (MAXZIDE-25) 37.5-25 MG per tablet Take 0.5 tablets by mouth daily.      No current facility-administered medications for this visit.    Review of Systems: GENERAL: negative for malaise, night sweats HEENT: No changes in hearing or vision, no nose bleeds or other nasal problems. NECK: Negative for lumps, goiter, pain and significant neck swelling RESPIRATORY: Negative for cough, wheezing CARDIOVASCULAR: Negative for chest pain, leg  swelling, palpitations, orthopnea GI: SEE HPI MUSCULOSKELETAL: Negative for joint pain or swelling, back pain, and muscle pain. SKIN: Negative for lesions, rash PSYCH: Negative for sleep disturbance, mood disorder and recent psychosocial stressors. HEMATOLOGY Negative for prolonged bleeding, bruising easily, and swollen nodes. ENDOCRINE: Negative for cold or heat intolerance, polyuria, polydipsia and goiter. NEURO: negative for tremor, gait imbalance, syncope and seizures. The remainder of the review of systems is noncontributory.   Physical Exam: BP 135/80 (BP Location: Right Arm, Patient Position: Sitting, Cuff Size: Large)   Pulse 65   Temp 98.2 F (36.8 C) (Oral)   Ht '5\' 5"'$  (1.651 m)   Wt 182 lb 3.2 oz (82.6 kg)   BMI 30.32 kg/m  GENERAL: The patient is AO x3, in no acute distress. HEENT: Head is normocephalic and atraumatic. EOMI are intact. Mouth is well hydrated and without lesions. NECK: Supple. No masses LUNGS: Clear to auscultation. No presence of rhonchi/wheezing/rales. Adequate chest expansion HEART: RRR, normal s1 and s2. ABDOMEN: Soft, nontender, no guarding, no peritoneal signs, and nondistended. BS +. No masses. EXTREMITIES: Without any  cyanosis, clubbing, rash, lesions or edema. NEUROLOGIC: AOx3, no focal motor deficit. SKIN: no jaundice, no rashes  Imaging/Labs: as above  I personally reviewed and interpreted the available labs, imaging and endoscopic files.  Impression and Plan: Dorothy Lawson is a 71 y.o. female with past medical history of GERD, hyperlipidemia, hypertension and arthritis, who presents for follow up of GERD.  The patient has presented adequate control of her GERD while taking the medication compliantly.  I discussed the possibility of decreasing her dosage to 20 mg every day which she is agreeable to.  If she presents recurrent symptoms then the pantoprazole will be increased back to 40 mg every day indefinitely.  Patient understood and  agreed. She is due for repeat colonoscopy in 6 years.  - Decrease pantoprazole to 20 mg qday - RTC 1 year  All questions were answered.      Harvel Quale, MD Gastroenterology and Hepatology [facility]

## 2020-09-20 NOTE — Patient Instructions (Signed)
Decrease pantoprazole to 20 mg qday

## 2020-11-02 DIAGNOSIS — Z6830 Body mass index (BMI) 30.0-30.9, adult: Secondary | ICD-10-CM | POA: Diagnosis not present

## 2020-11-02 DIAGNOSIS — Z299 Encounter for prophylactic measures, unspecified: Secondary | ICD-10-CM | POA: Diagnosis not present

## 2020-11-02 DIAGNOSIS — Z0000 Encounter for general adult medical examination without abnormal findings: Secondary | ICD-10-CM | POA: Diagnosis not present

## 2020-11-02 DIAGNOSIS — Z789 Other specified health status: Secondary | ICD-10-CM | POA: Diagnosis not present

## 2020-11-02 DIAGNOSIS — E669 Obesity, unspecified: Secondary | ICD-10-CM | POA: Diagnosis not present

## 2020-11-02 DIAGNOSIS — E7800 Pure hypercholesterolemia, unspecified: Secondary | ICD-10-CM | POA: Diagnosis not present

## 2020-11-02 DIAGNOSIS — Z79899 Other long term (current) drug therapy: Secondary | ICD-10-CM | POA: Diagnosis not present

## 2020-11-02 DIAGNOSIS — Z1339 Encounter for screening examination for other mental health and behavioral disorders: Secondary | ICD-10-CM | POA: Diagnosis not present

## 2020-11-02 DIAGNOSIS — R5383 Other fatigue: Secondary | ICD-10-CM | POA: Diagnosis not present

## 2020-11-02 DIAGNOSIS — I10 Essential (primary) hypertension: Secondary | ICD-10-CM | POA: Diagnosis not present

## 2020-11-02 DIAGNOSIS — Z1331 Encounter for screening for depression: Secondary | ICD-10-CM | POA: Diagnosis not present

## 2020-11-02 DIAGNOSIS — Z7189 Other specified counseling: Secondary | ICD-10-CM | POA: Diagnosis not present

## 2020-11-06 ENCOUNTER — Ambulatory Visit: Payer: Medicare PPO | Admitting: Dermatology

## 2020-11-19 ENCOUNTER — Ambulatory Visit: Payer: Medicare PPO | Admitting: Dermatology

## 2020-11-19 ENCOUNTER — Other Ambulatory Visit: Payer: Self-pay

## 2020-11-19 DIAGNOSIS — D485 Neoplasm of uncertain behavior of skin: Secondary | ICD-10-CM

## 2020-11-19 DIAGNOSIS — L820 Inflamed seborrheic keratosis: Secondary | ICD-10-CM

## 2020-11-19 DIAGNOSIS — Z1283 Encounter for screening for malignant neoplasm of skin: Secondary | ICD-10-CM

## 2020-11-19 NOTE — Patient Instructions (Signed)

## 2020-12-03 ENCOUNTER — Encounter: Payer: Self-pay | Admitting: Dermatology

## 2020-12-03 NOTE — Progress Notes (Signed)
   Follow-Up Visit   Subjective  Dorothy Lawson is a 70 y.o. female who presents for the following: Annual Exam (Patient here today for yearly skin check. Per patient she has a lesion on her left cheek x few months that does get irritated when she washes the area, no bleeding. Recheck lesions in the front of her left ear that was removed last year that is now coming back. No personal history or family history of atypical moles, melanoma or non mole skin cancer. ).  General skin examination, possible regrowth on left cheek Location:  Duration:  Quality:  Associated Signs/Symptoms: Modifying Factors:  Severity:  Timing: Context:   Objective  Well appearing patient in no apparent distress; mood and affect are within normal limits. Head Full body skin check, many keratoses that are flat. Angiomas on the chest, pink lesion the the nose, sebaceous gland hyperplasia (2 mm domed cream-colored papule with compatible dermoscopy) infra tip.  Left Buccal Cheek Pearly 3 mm pink papule on 10 days; no appreciable scar of a previous biopsy.       A full examination was performed including scalp, head, eyes, ears, nose, lips, neck, chest, axillae, abdomen, back, buttocks, bilateral upper extremities, bilateral lower extremities, hands, feet, fingers, toes, fingernails, and toenails. All findings within normal limits unless otherwise noted below.  Areas beneath undergarments not fully examined.   Assessment & Plan    Screening exam for skin cancer Head  Keep yearly skin checks   Neoplasm of uncertain behavior of skin Left Buccal Cheek  Skin / nail biopsy Type of biopsy: tangential   Informed consent: discussed and consent obtained   Timeout: patient name, date of birth, surgical site, and procedure verified   Anesthesia: the lesion was anesthetized in a standard fashion   Anesthetic:  1% lidocaine w/ epinephrine 1-100,000 local infiltration Instrument used: flexible razor blade    Hemostasis achieved with: aluminum chloride and electrodesiccation   Outcome: patient tolerated procedure well   Post-procedure details: wound care instructions given    Specimen 1 - Surgical pathology Differential Diagnosis: isk  Check Margins: No      I, Lavonna Monarch, MD, have reviewed all documentation for this visit.  The documentation on 12/03/20 for the exam, diagnosis, procedures, and orders are all accurate and complete.

## 2020-12-05 ENCOUNTER — Other Ambulatory Visit: Payer: Self-pay | Admitting: Internal Medicine

## 2020-12-05 DIAGNOSIS — Z139 Encounter for screening, unspecified: Secondary | ICD-10-CM

## 2020-12-11 ENCOUNTER — Other Ambulatory Visit: Payer: Self-pay

## 2020-12-11 ENCOUNTER — Ambulatory Visit
Admission: RE | Admit: 2020-12-11 | Discharge: 2020-12-11 | Disposition: A | Discharge status: Home / Self Care | Payer: Medicare PPO | Source: Ambulatory Visit | Attending: Internal Medicine | Admitting: Internal Medicine

## 2020-12-11 DIAGNOSIS — Z139 Encounter for screening, unspecified: Secondary | ICD-10-CM

## 2020-12-11 DIAGNOSIS — Z1231 Encounter for screening mammogram for malignant neoplasm of breast: Secondary | ICD-10-CM | POA: Diagnosis not present

## 2021-02-02 DIAGNOSIS — K219 Gastro-esophageal reflux disease without esophagitis: Secondary | ICD-10-CM | POA: Diagnosis not present

## 2021-02-02 DIAGNOSIS — Z803 Family history of malignant neoplasm of breast: Secondary | ICD-10-CM | POA: Diagnosis not present

## 2021-02-02 DIAGNOSIS — I10 Essential (primary) hypertension: Secondary | ICD-10-CM | POA: Diagnosis not present

## 2021-02-02 DIAGNOSIS — Z8249 Family history of ischemic heart disease and other diseases of the circulatory system: Secondary | ICD-10-CM | POA: Diagnosis not present

## 2021-02-02 DIAGNOSIS — Z7982 Long term (current) use of aspirin: Secondary | ICD-10-CM | POA: Diagnosis not present

## 2021-02-02 DIAGNOSIS — R32 Unspecified urinary incontinence: Secondary | ICD-10-CM | POA: Diagnosis not present

## 2021-02-02 DIAGNOSIS — Z20822 Contact with and (suspected) exposure to covid-19: Secondary | ICD-10-CM | POA: Diagnosis not present

## 2021-02-02 DIAGNOSIS — M1990 Unspecified osteoarthritis, unspecified site: Secondary | ICD-10-CM | POA: Diagnosis not present

## 2021-02-02 DIAGNOSIS — E785 Hyperlipidemia, unspecified: Secondary | ICD-10-CM | POA: Diagnosis not present

## 2021-04-08 DIAGNOSIS — H3540 Unspecified peripheral retinal degeneration: Secondary | ICD-10-CM | POA: Diagnosis not present

## 2021-05-01 DIAGNOSIS — R350 Frequency of micturition: Secondary | ICD-10-CM | POA: Diagnosis not present

## 2021-05-01 DIAGNOSIS — M25519 Pain in unspecified shoulder: Secondary | ICD-10-CM | POA: Diagnosis not present

## 2021-05-01 DIAGNOSIS — Z299 Encounter for prophylactic measures, unspecified: Secondary | ICD-10-CM | POA: Diagnosis not present

## 2021-05-01 DIAGNOSIS — I10 Essential (primary) hypertension: Secondary | ICD-10-CM | POA: Diagnosis not present

## 2021-05-01 DIAGNOSIS — B351 Tinea unguium: Secondary | ICD-10-CM | POA: Diagnosis not present

## 2021-05-01 DIAGNOSIS — E7800 Pure hypercholesterolemia, unspecified: Secondary | ICD-10-CM | POA: Diagnosis not present

## 2021-05-27 DIAGNOSIS — Z299 Encounter for prophylactic measures, unspecified: Secondary | ICD-10-CM | POA: Diagnosis not present

## 2021-05-27 DIAGNOSIS — I10 Essential (primary) hypertension: Secondary | ICD-10-CM | POA: Diagnosis not present

## 2021-05-27 DIAGNOSIS — M25519 Pain in unspecified shoulder: Secondary | ICD-10-CM | POA: Diagnosis not present

## 2021-05-30 DIAGNOSIS — B351 Tinea unguium: Secondary | ICD-10-CM | POA: Diagnosis not present

## 2021-05-30 DIAGNOSIS — M79676 Pain in unspecified toe(s): Secondary | ICD-10-CM | POA: Diagnosis not present

## 2021-06-10 DIAGNOSIS — M2550 Pain in unspecified joint: Secondary | ICD-10-CM | POA: Diagnosis not present

## 2021-06-10 DIAGNOSIS — Z299 Encounter for prophylactic measures, unspecified: Secondary | ICD-10-CM | POA: Diagnosis not present

## 2021-06-10 DIAGNOSIS — N1831 Chronic kidney disease, stage 3a: Secondary | ICD-10-CM | POA: Diagnosis not present

## 2021-06-10 DIAGNOSIS — D692 Other nonthrombocytopenic purpura: Secondary | ICD-10-CM | POA: Diagnosis not present

## 2021-06-10 DIAGNOSIS — I10 Essential (primary) hypertension: Secondary | ICD-10-CM | POA: Diagnosis not present

## 2021-06-19 ENCOUNTER — Other Ambulatory Visit (INDEPENDENT_AMBULATORY_CARE_PROVIDER_SITE_OTHER): Payer: Self-pay | Admitting: Gastroenterology

## 2021-06-19 DIAGNOSIS — K219 Gastro-esophageal reflux disease without esophagitis: Secondary | ICD-10-CM

## 2021-07-18 DIAGNOSIS — I10 Essential (primary) hypertension: Secondary | ICD-10-CM | POA: Diagnosis not present

## 2021-07-18 DIAGNOSIS — Z1339 Encounter for screening examination for other mental health and behavioral disorders: Secondary | ICD-10-CM | POA: Diagnosis not present

## 2021-07-18 DIAGNOSIS — Z1331 Encounter for screening for depression: Secondary | ICD-10-CM | POA: Diagnosis not present

## 2021-07-18 DIAGNOSIS — Z7189 Other specified counseling: Secondary | ICD-10-CM | POA: Diagnosis not present

## 2021-07-18 DIAGNOSIS — Z299 Encounter for prophylactic measures, unspecified: Secondary | ICD-10-CM | POA: Diagnosis not present

## 2021-07-18 DIAGNOSIS — B351 Tinea unguium: Secondary | ICD-10-CM | POA: Diagnosis not present

## 2021-07-18 DIAGNOSIS — M159 Polyosteoarthritis, unspecified: Secondary | ICD-10-CM | POA: Diagnosis not present

## 2021-07-18 DIAGNOSIS — Z6828 Body mass index (BMI) 28.0-28.9, adult: Secondary | ICD-10-CM | POA: Diagnosis not present

## 2021-07-18 DIAGNOSIS — Z0000 Encounter for general adult medical examination without abnormal findings: Secondary | ICD-10-CM | POA: Diagnosis not present

## 2021-07-29 DIAGNOSIS — N390 Urinary tract infection, site not specified: Secondary | ICD-10-CM | POA: Diagnosis not present

## 2021-07-29 DIAGNOSIS — R350 Frequency of micturition: Secondary | ICD-10-CM | POA: Diagnosis not present

## 2021-07-29 DIAGNOSIS — I10 Essential (primary) hypertension: Secondary | ICD-10-CM | POA: Diagnosis not present

## 2021-07-29 DIAGNOSIS — Z299 Encounter for prophylactic measures, unspecified: Secondary | ICD-10-CM | POA: Diagnosis not present

## 2021-07-29 DIAGNOSIS — R1032 Left lower quadrant pain: Secondary | ICD-10-CM | POA: Diagnosis not present

## 2021-09-03 DIAGNOSIS — B351 Tinea unguium: Secondary | ICD-10-CM | POA: Diagnosis not present

## 2021-09-03 DIAGNOSIS — M79676 Pain in unspecified toe(s): Secondary | ICD-10-CM | POA: Diagnosis not present

## 2021-09-23 ENCOUNTER — Ambulatory Visit (INDEPENDENT_AMBULATORY_CARE_PROVIDER_SITE_OTHER): Payer: Medicare PPO | Admitting: Gastroenterology

## 2021-09-23 ENCOUNTER — Encounter (INDEPENDENT_AMBULATORY_CARE_PROVIDER_SITE_OTHER): Payer: Self-pay | Admitting: Gastroenterology

## 2021-09-23 VITALS — BP 133/76 | HR 61 | Temp 98.0°F | Ht 66.0 in | Wt 167.6 lb

## 2021-09-23 DIAGNOSIS — R1319 Other dysphagia: Secondary | ICD-10-CM | POA: Diagnosis not present

## 2021-09-23 DIAGNOSIS — K219 Gastro-esophageal reflux disease without esophagitis: Secondary | ICD-10-CM

## 2021-09-23 NOTE — Patient Instructions (Signed)
Continue pantoprazole 20 mg qday

## 2021-09-23 NOTE — Progress Notes (Signed)
[name], M.D. Gastroenterology & Hepatology [facility] [address] [place], [place]  [address]  Primary Care Physician: Glenda Chroman, MD [place] [id number]  I will communicate my assessment and recommendations to the referring MD via EMR.  Problems: GERD Dysphagia  History of Present Illness: Dorothy Lawson is a 71 y.o. female with past medical history of GERD, hyperlipidemia, hypertension and arthritis, who presents for follow up of GERD and dysphagia.  The patient was last seen on 09/20/2020. At that time, the patient was advised to increase the pantoprazole to 20 mg every day.  She denies having any complaints. States taking pantoprazole 20 mg qday compliantly, which prevents her heartburn or odynophagia. Has very seldom dysphagia but this does not prevent her from having any type of food. The patient denies having any nausea, vomiting, fever, chills, hematochezia, melena, hematemesis, abdominal distention, abdominal pain, diarrhea, jaundice, pruritus. Lost  15 lb intentionally since last visit, has been doing more exercise recently - has been swimming often.  Last EGD: 02/2019-- Esophageal polyp was found. Ablated via cold biopsy.  - LA Grade A reflux esophagitis with no bleeding.  - 3 cm hiatal hernia. - No endoscopic esophageal abnormality to explain  patient's dysphagia. Esophagus dilated prior to biopsy. Dilated.  - Gastritis. Biopsied.  - Normal duodenal bulb and second portion of the duodenum.   A. STOMACH, BIOPSY:  - Reactive gastropathy.  - Warthin-Starry is negative for Helicobacter pylori.  - No intestinal metaplasia, dysplasia, or malignancy.   B. ESOPHAGUS, POLYPECTOMY:  - Benign squamous papilloma.   Last Colonoscopy: 02/2019-- The examined portion of the ileum was normal. - Diverticulosis in the sigmoid colon.  - External hemorrhoids. - Anal papilla(e) were hypertrophied.  - No  specimens collected  Past Medical History: Past Medical History:  Diagnosis Date   Arthritis 05-03-12   osteoarthritis - knees   GERD (gastroesophageal reflux disease)    tx. Prilosec   Hypercholesterolemia 04-15-12   tx. meds   Hypertension     Past Surgical History: Past Surgical History:  Procedure Laterality Date   BIOPSY  03/02/2019   Procedure: BIOPSY;  Surgeon: Rogene Houston, MD;  [facility];  Service: Endoscopy;;  gastric   COLONOSCOPY N/A 03/16/2014   Procedure: COLONOSCOPY;  Surgeon: Rogene Houston, MD;  [facility];  Service: Endoscopy;  Laterality: N/A;  1015 - moved to 2/11 @ 9:30   COLONOSCOPY W/ POLYPECTOMY     COLONOSCOPY WITH PROPOFOL N/A 03/02/2019   Procedure: COLONOSCOPY WITH PROPOFOL;  Surgeon: Rogene Houston, MD;  [facility];  Service: Endoscopy;  Laterality: N/A;  10:30   ESOPHAGOGASTRODUODENOSCOPY (EGD) WITH PROPOFOL N/A 03/02/2019   Procedure: ESOPHAGOGASTRODUODENOSCOPY (EGD) /ESOPHAGEAL DILATION WITH PROPOFOL;  Surgeon: Rogene Houston, MD;  [facility];  Service: Endoscopy;  Laterality: N/A;   POLYPECTOMY  03/02/2019   Procedure: POLYPECTOMY;  Surgeon: Rogene Houston, MD;  [facility];  Service: Endoscopy;;  esophageal   TOTAL KNEE ARTHROPLASTY Left 05/10/2012   Procedure: TOTAL KNEE ARTHROPLASTY;  Surgeon: [name], MD;  [facility]: [facility];  Service: Orthopedics;  Laterality: Left;   TOTAL KNEE ARTHROPLASTY Right 05/19/2016   Procedure: RIGHT TOTAL KNEE ARTHROPLASTY;  Surgeon: Gaynelle Arabian, MD;  Location: [facility];  Service: Orthopedics;  Laterality: Right;  requests 31mns   TUBAL LIGATION      Family History: Family History  Problem Relation Age of  Onset   Breast cancer Mother    Hypertension Mother    Hypertension Father    Dementia Father    Breast cancer Sister     Social History: Social History   Tobacco Use  Smoking Status Never  Smokeless Tobacco Never   Social  History   Substance and Sexual Activity  Alcohol Use Not Currently   Comment: rare-seldom   Social History   Substance and Sexual Activity  Drug Use No    Allergies: Allergies  Allergen Reactions   Macrodantin [Nitrofurantoin] Other (See Comments)    Bumps in mouth    Keflex [Cephalexin] Rash    Medications: Current Outpatient Medications  Medication Sig Dispense Refill   acetaminophen (TYLENOL) 500 MG tablet Take 1,000 mg by mouth every 6 (six) hours as needed (for pain.).     aspirin EC 81 MG tablet Take 1 tablet (81 mg total) by mouth at bedtime.     atorvastatin (LIPITOR) 10 MG tablet Take 10 mg by mouth at bedtime.   3   Calcium Carbonate-Vit D-Min (CALCIUM 1200 PO) Take 1 tablet by mouth 2 (two) times daily.      Cholecalciferol (VITAMIN D3) 10 MCG (400 UNIT) CAPS Take 1 tablet by mouth 2 (two) times daily.      Glucosamine-Chondroit-Vit C-Mn (GLUCOSAMINE 1500 COMPLEX) CAPS Take 1 capsule by mouth daily.      Magnesium 250 MG TABS Take 250 mg by mouth daily.      metoprolol (LOPRESSOR) 50 MG tablet Take 50 mg by mouth 2 (two) times daily.     Multiple Vitamins-Minerals (CENTRUM SILVER 50+WOMEN PO) Take 1 tablet by mouth daily.      pantoprazole (PROTONIX) 20 MG tablet TAKE ONE TABLET BY MOUTH EVERY DAY 90 tablet 3   Propylene Glycol (SYSTANE COMPLETE OP) Place 1 drop into both eyes 2 (two) times daily.     psyllium (METAMUCIL) 58.6 % packet Take 1 packet by mouth at bedtime.     triamterene-hydrochlorothiazide (MAXZIDE-25) 37.5-25 MG per tablet Take 0.5 tablets by mouth daily.      No current facility-administered medications for this visit.    Review of Systems: GENERAL: negative for malaise, night sweats HEENT: No changes in hearing or vision, no nose bleeds or other nasal problems. NECK: Negative for lumps, goiter, pain and significant neck swelling RESPIRATORY: Negative for cough, wheezing CARDIOVASCULAR: Negative for chest pain, leg swelling, palpitations,  orthopnea GI: SEE HPI MUSCULOSKELETAL: Negative for joint pain or swelling, back pain, and muscle pain. SKIN: Negative for lesions, rash PSYCH: Negative for sleep disturbance, mood disorder and recent psychosocial stressors. HEMATOLOGY Negative for prolonged bleeding, bruising easily, and swollen nodes. ENDOCRINE: Negative for cold or heat intolerance, polyuria, polydipsia and goiter. NEURO: negative for tremor, gait imbalance, syncope and seizures. The remainder of the review of systems is noncontributory.   Physical Exam: BP 133/76 (BP Location: Left Arm, Patient Position: Sitting, Cuff Size: Small)   Pulse 61   Temp 98 F (36.7 C) (Oral)   Ht '5\' 6"'$  (1.676 m)   Wt 167 lb 9.6 oz (76 kg)   BMI 27.05 kg/m  GENERAL: The patient is AO x3, in no acute distress. HEENT: Head is normocephalic and atraumatic. EOMI are intact. Mouth is well hydrated and without lesions. NECK: Supple. No masses LUNGS: Clear to auscultation. No presence of rhonchi/wheezing/rales. Adequate chest expansion HEART: RRR, normal s1 and s2. ABDOMEN: Soft, nontender, no guarding, no peritoneal signs, and nondistended. BS +. No masses. EXTREMITIES: Without any  cyanosis, clubbing, rash, lesions or edema. NEUROLOGIC: AOx3, no focal motor deficit. SKIN: no jaundice, no rashes  Imaging/Labs: as above  I personally reviewed and interpreted the available labs, imaging and endoscopic files.  Impression and Plan: Dorothy Lawson is a 71 y.o. female with past medical history of GERD, hyperlipidemia, hypertension and arthritis, who presents for follow up of GERD and dysphagia.  The patient has presented significant improvement of her symptoms while taking low-dose pantoprazole without having any side effects due to the medication.  She has not presented any recurrent dysphagia and GERD is well controlled.  We will continue same dose of medication.  I congratulated the patient as she has lost some weight which has likely led  to further symptom control.  - Continue pantoprazole 20 mg qday  All questions were answered.      Harvel Quale, MD Gastroenterology and Hepatology [facility]

## 2021-10-16 DIAGNOSIS — Z79899 Other long term (current) drug therapy: Secondary | ICD-10-CM | POA: Diagnosis not present

## 2021-10-16 DIAGNOSIS — I10 Essential (primary) hypertension: Secondary | ICD-10-CM | POA: Diagnosis not present

## 2021-10-16 DIAGNOSIS — E7800 Pure hypercholesterolemia, unspecified: Secondary | ICD-10-CM | POA: Diagnosis not present

## 2021-10-16 DIAGNOSIS — R5383 Other fatigue: Secondary | ICD-10-CM | POA: Diagnosis not present

## 2021-10-16 DIAGNOSIS — Z0000 Encounter for general adult medical examination without abnormal findings: Secondary | ICD-10-CM | POA: Diagnosis not present

## 2021-10-16 DIAGNOSIS — E559 Vitamin D deficiency, unspecified: Secondary | ICD-10-CM | POA: Diagnosis not present

## 2021-10-16 DIAGNOSIS — Z6827 Body mass index (BMI) 27.0-27.9, adult: Secondary | ICD-10-CM | POA: Diagnosis not present

## 2021-10-16 DIAGNOSIS — Z299 Encounter for prophylactic measures, unspecified: Secondary | ICD-10-CM | POA: Diagnosis not present

## 2021-11-19 ENCOUNTER — Ambulatory Visit: Payer: Medicare PPO | Admitting: Dermatology

## 2021-11-20 ENCOUNTER — Ambulatory Visit: Payer: Medicare PPO | Admitting: Dermatology

## 2021-11-25 DIAGNOSIS — Z299 Encounter for prophylactic measures, unspecified: Secondary | ICD-10-CM | POA: Diagnosis not present

## 2021-11-25 DIAGNOSIS — M542 Cervicalgia: Secondary | ICD-10-CM | POA: Diagnosis not present

## 2021-11-25 DIAGNOSIS — I10 Essential (primary) hypertension: Secondary | ICD-10-CM | POA: Diagnosis not present

## 2021-11-25 DIAGNOSIS — H6690 Otitis media, unspecified, unspecified ear: Secondary | ICD-10-CM | POA: Diagnosis not present

## 2021-11-29 DIAGNOSIS — E2839 Other primary ovarian failure: Secondary | ICD-10-CM | POA: Diagnosis not present

## 2021-12-03 DIAGNOSIS — B351 Tinea unguium: Secondary | ICD-10-CM | POA: Diagnosis not present

## 2021-12-03 DIAGNOSIS — M79676 Pain in unspecified toe(s): Secondary | ICD-10-CM | POA: Diagnosis not present

## 2021-12-13 DIAGNOSIS — Z1231 Encounter for screening mammogram for malignant neoplasm of breast: Secondary | ICD-10-CM | POA: Diagnosis not present

## 2022-01-09 DIAGNOSIS — B351 Tinea unguium: Secondary | ICD-10-CM | POA: Diagnosis not present

## 2022-01-09 DIAGNOSIS — M79676 Pain in unspecified toe(s): Secondary | ICD-10-CM | POA: Diagnosis not present

## 2022-01-20 DIAGNOSIS — D692 Other nonthrombocytopenic purpura: Secondary | ICD-10-CM | POA: Diagnosis not present

## 2022-01-20 DIAGNOSIS — J40 Bronchitis, not specified as acute or chronic: Secondary | ICD-10-CM | POA: Diagnosis not present

## 2022-01-20 DIAGNOSIS — N1831 Chronic kidney disease, stage 3a: Secondary | ICD-10-CM | POA: Diagnosis not present

## 2022-01-20 DIAGNOSIS — Z299 Encounter for prophylactic measures, unspecified: Secondary | ICD-10-CM | POA: Diagnosis not present

## 2022-01-23 DIAGNOSIS — M79676 Pain in unspecified toe(s): Secondary | ICD-10-CM | POA: Diagnosis not present

## 2022-01-23 DIAGNOSIS — B351 Tinea unguium: Secondary | ICD-10-CM | POA: Diagnosis not present

## 2022-01-30 IMAGING — MG MM DIGITAL SCREENING BILAT W/ TOMO AND CAD
8 series · 8 of 24 positions shown · non-contrast
Comparison: Previous exam(s).

CLINICAL DATA: Screening.

EXAM:
DIGITAL SCREENING BILATERAL MAMMOGRAM WITH TOMOSYNTHESIS AND CAD
TECHNIQUE: Bilateral screening digital craniocaudal and mediolateral oblique
mammograms were obtained. Bilateral screening digital breast
tomosynthesis was performed. The images were evaluated with
computer-aided detection.

[L MLO synth-2D]
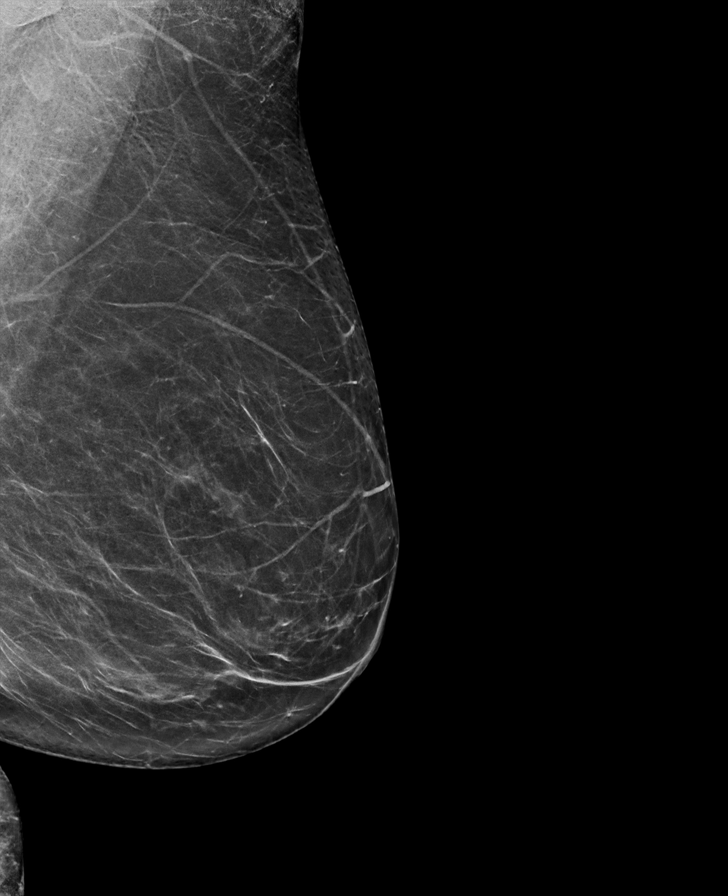

[R CC synth-2D]
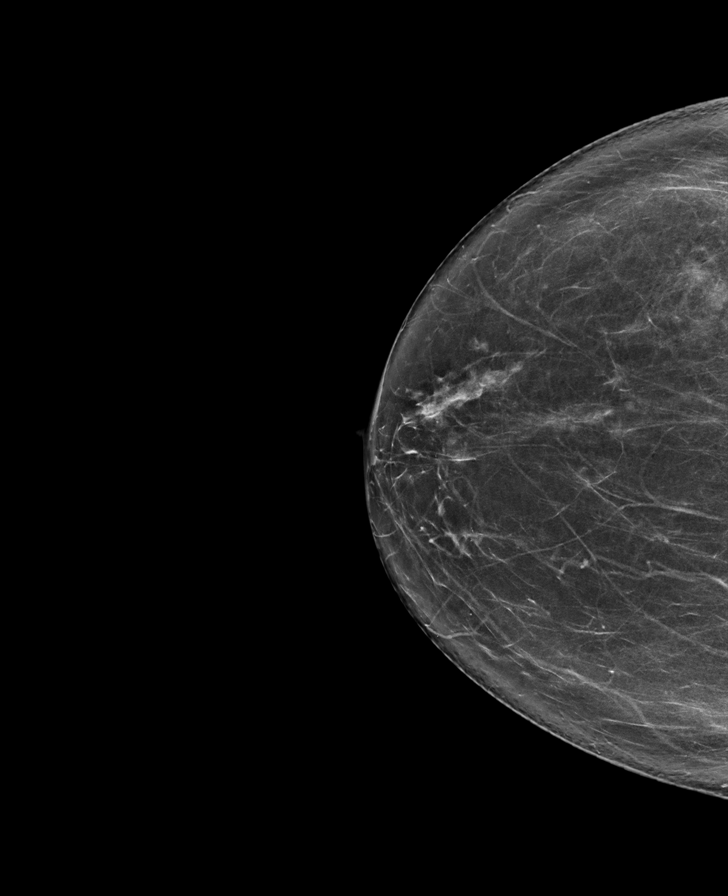

[R MLO synth-2D]
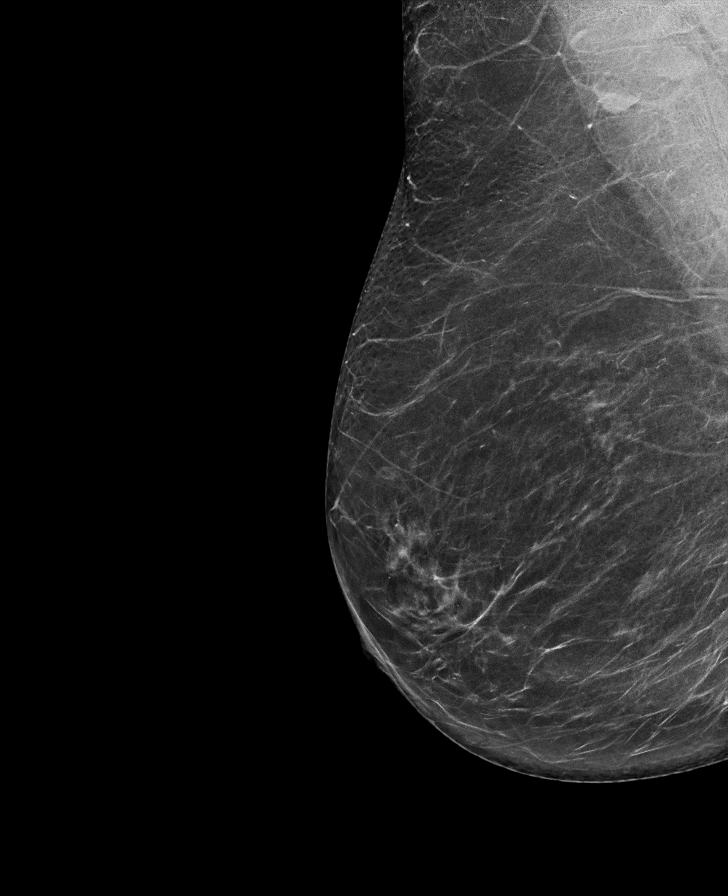

[L CC synth-2D]
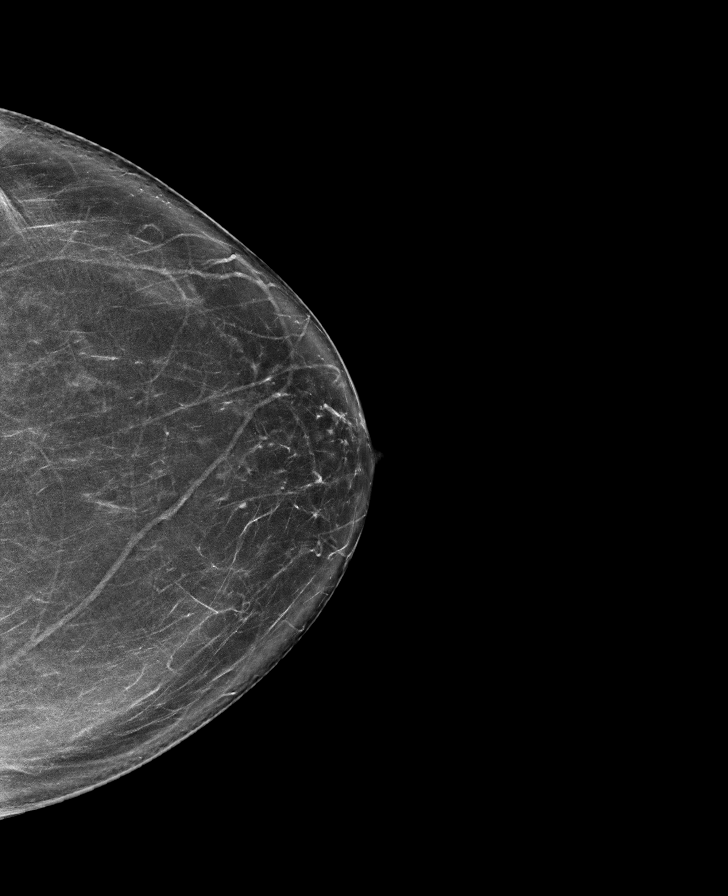

[R MLO tomo · tomo slice 39/77.0]
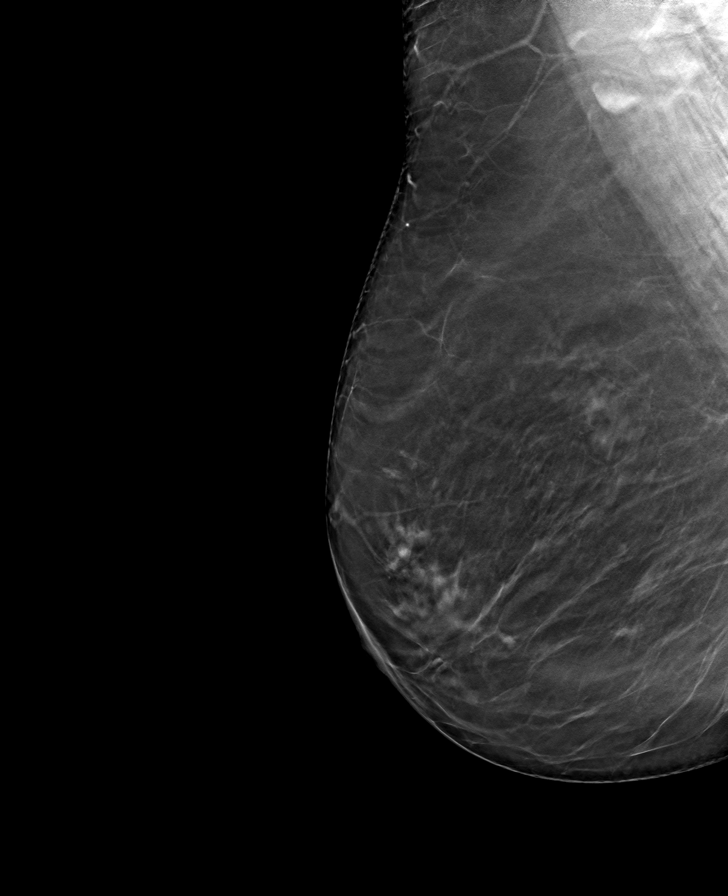

[R CC tomo · tomo slice 39/76.0]
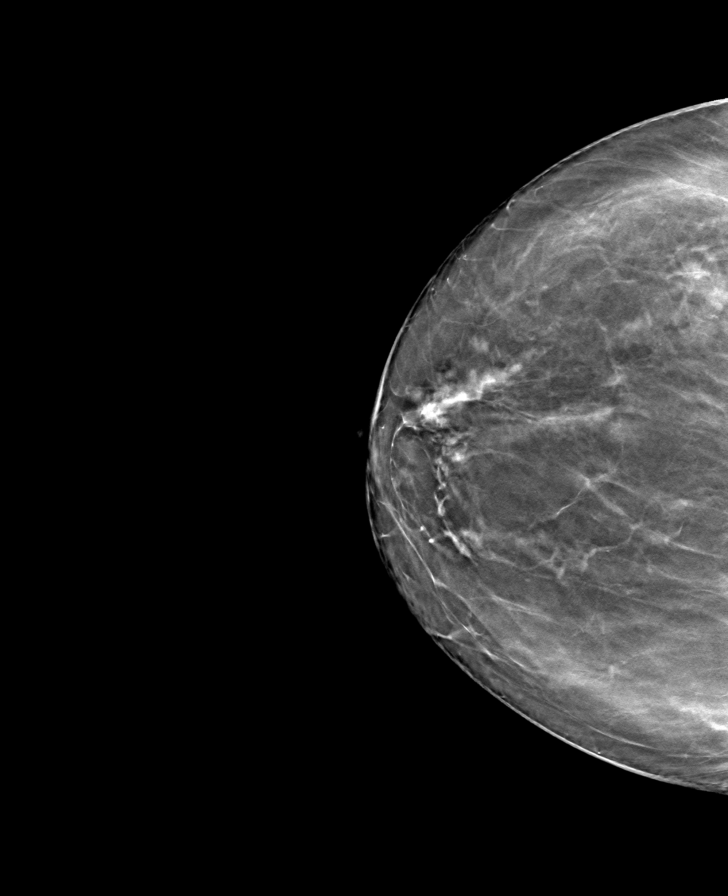

[L MLO tomo · tomo slice 39/78.0]
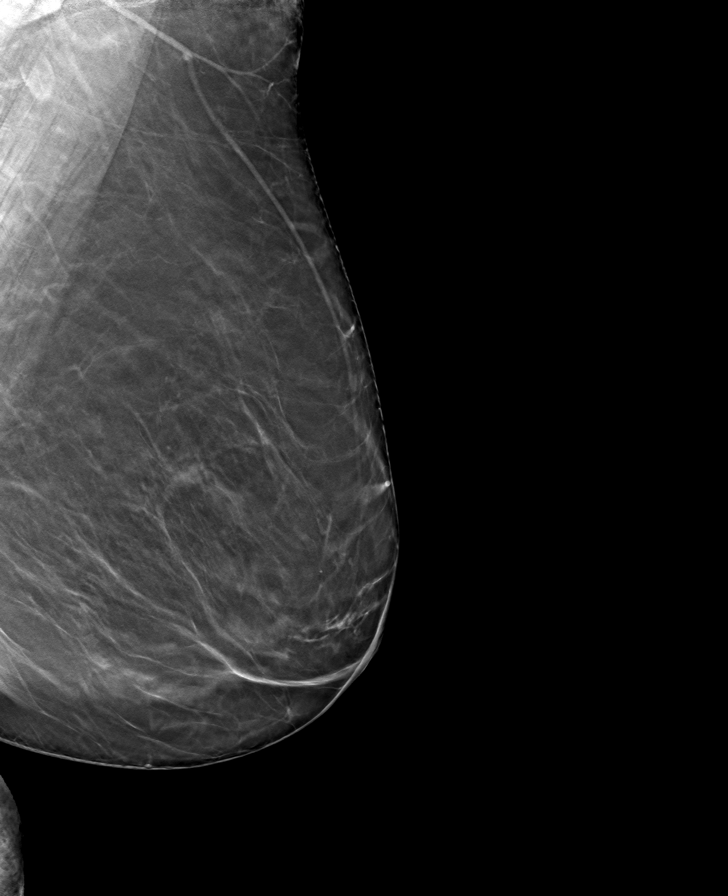

[L CC tomo · tomo slice 40/79.0]
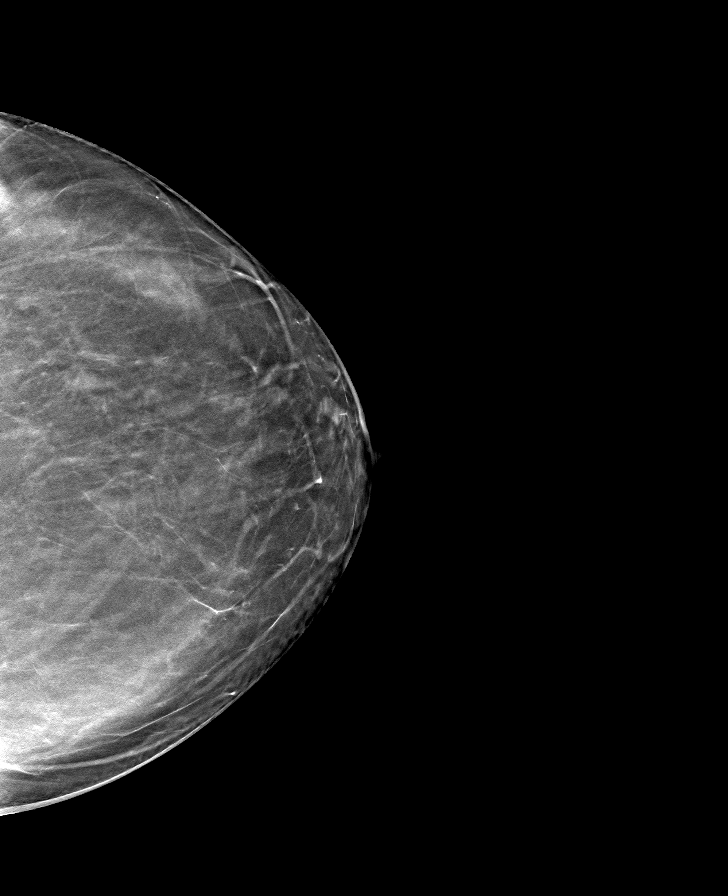

[8 of 24 positions shown; findings below may reference images not displayed]

ACR Breast Density Category b: There are scattered areas of
fibroglandular density.
FINDINGS: There are no findings suspicious for malignancy.
IMPRESSION: No mammographic evidence of malignancy. A result letter of this
screening mammogram will be mailed directly to the patient.

RECOMMENDATION:
Screening mammogram in one year. (Code:51-O-LD2)

BI-RADS CATEGORY  1: Negative.

## 2022-03-26 DIAGNOSIS — U071 COVID-19: Secondary | ICD-10-CM | POA: Diagnosis not present

## 2022-03-26 DIAGNOSIS — R5383 Other fatigue: Secondary | ICD-10-CM | POA: Diagnosis not present

## 2022-04-09 DIAGNOSIS — H43813 Vitreous degeneration, bilateral: Secondary | ICD-10-CM | POA: Diagnosis not present

## 2022-04-15 DIAGNOSIS — B351 Tinea unguium: Secondary | ICD-10-CM | POA: Diagnosis not present

## 2022-04-15 DIAGNOSIS — M79676 Pain in unspecified toe(s): Secondary | ICD-10-CM | POA: Diagnosis not present

## 2022-04-28 DIAGNOSIS — H6992 Unspecified Eustachian tube disorder, left ear: Secondary | ICD-10-CM | POA: Diagnosis not present

## 2022-04-28 DIAGNOSIS — J069 Acute upper respiratory infection, unspecified: Secondary | ICD-10-CM | POA: Diagnosis not present

## 2022-04-28 DIAGNOSIS — Z299 Encounter for prophylactic measures, unspecified: Secondary | ICD-10-CM | POA: Diagnosis not present

## 2022-04-28 DIAGNOSIS — R6883 Chills (without fever): Secondary | ICD-10-CM | POA: Diagnosis not present

## 2022-05-22 DIAGNOSIS — L568 Other specified acute skin changes due to ultraviolet radiation: Secondary | ICD-10-CM | POA: Diagnosis not present

## 2022-05-22 DIAGNOSIS — Z85828 Personal history of other malignant neoplasm of skin: Secondary | ICD-10-CM | POA: Diagnosis not present

## 2022-05-22 DIAGNOSIS — D229 Melanocytic nevi, unspecified: Secondary | ICD-10-CM | POA: Diagnosis not present

## 2022-05-22 DIAGNOSIS — Z1283 Encounter for screening for malignant neoplasm of skin: Secondary | ICD-10-CM | POA: Diagnosis not present

## 2022-05-22 DIAGNOSIS — Z08 Encounter for follow-up examination after completed treatment for malignant neoplasm: Secondary | ICD-10-CM | POA: Diagnosis not present

## 2022-05-22 DIAGNOSIS — L821 Other seborrheic keratosis: Secondary | ICD-10-CM | POA: Diagnosis not present

## 2022-06-03 DIAGNOSIS — M542 Cervicalgia: Secondary | ICD-10-CM | POA: Diagnosis not present

## 2022-06-09 ENCOUNTER — Other Ambulatory Visit (INDEPENDENT_AMBULATORY_CARE_PROVIDER_SITE_OTHER): Payer: Self-pay | Admitting: Gastroenterology

## 2022-06-09 DIAGNOSIS — N1831 Chronic kidney disease, stage 3a: Secondary | ICD-10-CM | POA: Diagnosis not present

## 2022-06-09 DIAGNOSIS — Z299 Encounter for prophylactic measures, unspecified: Secondary | ICD-10-CM | POA: Diagnosis not present

## 2022-06-09 DIAGNOSIS — D692 Other nonthrombocytopenic purpura: Secondary | ICD-10-CM | POA: Diagnosis not present

## 2022-06-09 DIAGNOSIS — W57XXXA Bitten or stung by nonvenomous insect and other nonvenomous arthropods, initial encounter: Secondary | ICD-10-CM | POA: Diagnosis not present

## 2022-06-09 DIAGNOSIS — I10 Essential (primary) hypertension: Secondary | ICD-10-CM | POA: Diagnosis not present

## 2022-06-09 DIAGNOSIS — K219 Gastro-esophageal reflux disease without esophagitis: Secondary | ICD-10-CM

## 2022-06-12 DIAGNOSIS — M40202 Unspecified kyphosis, cervical region: Secondary | ICD-10-CM | POA: Diagnosis not present

## 2022-06-12 DIAGNOSIS — M542 Cervicalgia: Secondary | ICD-10-CM | POA: Diagnosis not present

## 2022-06-16 DIAGNOSIS — M542 Cervicalgia: Secondary | ICD-10-CM | POA: Diagnosis not present

## 2022-06-16 DIAGNOSIS — M40202 Unspecified kyphosis, cervical region: Secondary | ICD-10-CM | POA: Diagnosis not present

## 2022-06-19 ENCOUNTER — Other Ambulatory Visit (INDEPENDENT_AMBULATORY_CARE_PROVIDER_SITE_OTHER): Payer: Self-pay | Admitting: *Deleted

## 2022-06-19 DIAGNOSIS — K219 Gastro-esophageal reflux disease without esophagitis: Secondary | ICD-10-CM

## 2022-06-19 MED ORDER — PANTOPRAZOLE SODIUM 20 MG PO TBEC: 20.0000 mg | DELAYED_RELEASE_TABLET | Freq: Every day | ORAL | 1 refills | Status: DC

## 2022-07-02 DIAGNOSIS — M40202 Unspecified kyphosis, cervical region: Secondary | ICD-10-CM | POA: Diagnosis not present

## 2022-07-02 DIAGNOSIS — M542 Cervicalgia: Secondary | ICD-10-CM | POA: Diagnosis not present

## 2022-07-03 ENCOUNTER — Encounter (INDEPENDENT_AMBULATORY_CARE_PROVIDER_SITE_OTHER): Payer: Self-pay | Admitting: Gastroenterology

## 2022-07-04 DIAGNOSIS — M40202 Unspecified kyphosis, cervical region: Secondary | ICD-10-CM | POA: Diagnosis not present

## 2022-07-04 DIAGNOSIS — M542 Cervicalgia: Secondary | ICD-10-CM | POA: Diagnosis not present

## 2022-07-07 DIAGNOSIS — M542 Cervicalgia: Secondary | ICD-10-CM | POA: Diagnosis not present

## 2022-07-07 DIAGNOSIS — M40202 Unspecified kyphosis, cervical region: Secondary | ICD-10-CM | POA: Diagnosis not present

## 2022-07-09 DIAGNOSIS — M40202 Unspecified kyphosis, cervical region: Secondary | ICD-10-CM | POA: Diagnosis not present

## 2022-07-09 DIAGNOSIS — M542 Cervicalgia: Secondary | ICD-10-CM | POA: Diagnosis not present

## 2022-07-15 DIAGNOSIS — M79676 Pain in unspecified toe(s): Secondary | ICD-10-CM | POA: Diagnosis not present

## 2022-07-15 DIAGNOSIS — B351 Tinea unguium: Secondary | ICD-10-CM | POA: Diagnosis not present

## 2022-07-16 DIAGNOSIS — M542 Cervicalgia: Secondary | ICD-10-CM | POA: Diagnosis not present

## 2022-07-16 DIAGNOSIS — M40202 Unspecified kyphosis, cervical region: Secondary | ICD-10-CM | POA: Diagnosis not present

## 2022-07-18 DIAGNOSIS — M1990 Unspecified osteoarthritis, unspecified site: Secondary | ICD-10-CM | POA: Diagnosis not present

## 2022-07-18 DIAGNOSIS — Z881 Allergy status to other antibiotic agents status: Secondary | ICD-10-CM | POA: Diagnosis not present

## 2022-07-18 DIAGNOSIS — N182 Chronic kidney disease, stage 2 (mild): Secondary | ICD-10-CM | POA: Diagnosis not present

## 2022-07-18 DIAGNOSIS — I129 Hypertensive chronic kidney disease with stage 1 through stage 4 chronic kidney disease, or unspecified chronic kidney disease: Secondary | ICD-10-CM | POA: Diagnosis not present

## 2022-07-18 DIAGNOSIS — M40202 Unspecified kyphosis, cervical region: Secondary | ICD-10-CM | POA: Diagnosis not present

## 2022-07-18 DIAGNOSIS — R32 Unspecified urinary incontinence: Secondary | ICD-10-CM | POA: Diagnosis not present

## 2022-07-18 DIAGNOSIS — Z791 Long term (current) use of non-steroidal anti-inflammatories (NSAID): Secondary | ICD-10-CM | POA: Diagnosis not present

## 2022-07-18 DIAGNOSIS — Z7982 Long term (current) use of aspirin: Secondary | ICD-10-CM | POA: Diagnosis not present

## 2022-07-18 DIAGNOSIS — E785 Hyperlipidemia, unspecified: Secondary | ICD-10-CM | POA: Diagnosis not present

## 2022-07-18 DIAGNOSIS — M542 Cervicalgia: Secondary | ICD-10-CM | POA: Diagnosis not present

## 2022-07-23 DIAGNOSIS — M542 Cervicalgia: Secondary | ICD-10-CM | POA: Diagnosis not present

## 2022-07-23 DIAGNOSIS — M40202 Unspecified kyphosis, cervical region: Secondary | ICD-10-CM | POA: Diagnosis not present

## 2022-07-24 DIAGNOSIS — Z1339 Encounter for screening examination for other mental health and behavioral disorders: Secondary | ICD-10-CM | POA: Diagnosis not present

## 2022-07-24 DIAGNOSIS — Z299 Encounter for prophylactic measures, unspecified: Secondary | ICD-10-CM | POA: Diagnosis not present

## 2022-07-24 DIAGNOSIS — Z1331 Encounter for screening for depression: Secondary | ICD-10-CM | POA: Diagnosis not present

## 2022-07-24 DIAGNOSIS — Z0000 Encounter for general adult medical examination without abnormal findings: Secondary | ICD-10-CM | POA: Diagnosis not present

## 2022-07-24 DIAGNOSIS — Z1389 Encounter for screening for other disorder: Secondary | ICD-10-CM | POA: Diagnosis not present

## 2022-07-24 DIAGNOSIS — I10 Essential (primary) hypertension: Secondary | ICD-10-CM | POA: Diagnosis not present

## 2022-07-24 DIAGNOSIS — Z6828 Body mass index (BMI) 28.0-28.9, adult: Secondary | ICD-10-CM | POA: Diagnosis not present

## 2022-07-24 DIAGNOSIS — Z7189 Other specified counseling: Secondary | ICD-10-CM | POA: Diagnosis not present

## 2022-07-25 DIAGNOSIS — M542 Cervicalgia: Secondary | ICD-10-CM | POA: Diagnosis not present

## 2022-07-25 DIAGNOSIS — M40202 Unspecified kyphosis, cervical region: Secondary | ICD-10-CM | POA: Diagnosis not present

## 2022-07-29 DIAGNOSIS — M40202 Unspecified kyphosis, cervical region: Secondary | ICD-10-CM | POA: Diagnosis not present

## 2022-07-29 DIAGNOSIS — M542 Cervicalgia: Secondary | ICD-10-CM | POA: Diagnosis not present

## 2022-07-31 DIAGNOSIS — M542 Cervicalgia: Secondary | ICD-10-CM | POA: Diagnosis not present

## 2022-07-31 DIAGNOSIS — M40202 Unspecified kyphosis, cervical region: Secondary | ICD-10-CM | POA: Diagnosis not present

## 2022-09-25 ENCOUNTER — Ambulatory Visit (INDEPENDENT_AMBULATORY_CARE_PROVIDER_SITE_OTHER): Payer: Medicare PPO | Admitting: Gastroenterology

## 2022-10-14 DIAGNOSIS — M79676 Pain in unspecified toe(s): Secondary | ICD-10-CM | POA: Diagnosis not present

## 2022-10-14 DIAGNOSIS — B351 Tinea unguium: Secondary | ICD-10-CM | POA: Diagnosis not present

## 2022-10-17 DIAGNOSIS — E7800 Pure hypercholesterolemia, unspecified: Secondary | ICD-10-CM | POA: Diagnosis not present

## 2022-10-17 DIAGNOSIS — Z299 Encounter for prophylactic measures, unspecified: Secondary | ICD-10-CM | POA: Diagnosis not present

## 2022-10-17 DIAGNOSIS — R5383 Other fatigue: Secondary | ICD-10-CM | POA: Diagnosis not present

## 2022-10-17 DIAGNOSIS — Z79899 Other long term (current) drug therapy: Secondary | ICD-10-CM | POA: Diagnosis not present

## 2022-10-17 DIAGNOSIS — I10 Essential (primary) hypertension: Secondary | ICD-10-CM | POA: Diagnosis not present

## 2022-10-17 DIAGNOSIS — Z0000 Encounter for general adult medical examination without abnormal findings: Secondary | ICD-10-CM | POA: Diagnosis not present

## 2022-10-30 ENCOUNTER — Ambulatory Visit (INDEPENDENT_AMBULATORY_CARE_PROVIDER_SITE_OTHER): Payer: Medicare PPO | Admitting: Gastroenterology

## 2022-10-30 ENCOUNTER — Encounter (INDEPENDENT_AMBULATORY_CARE_PROVIDER_SITE_OTHER): Payer: Self-pay | Admitting: Gastroenterology

## 2022-10-30 VITALS — BP 130/80 | HR 58 | Temp 98.0°F | Ht 66.0 in | Wt 172.1 lb

## 2022-10-30 DIAGNOSIS — K219 Gastro-esophageal reflux disease without esophagitis: Secondary | ICD-10-CM | POA: Diagnosis not present

## 2022-10-30 NOTE — Progress Notes (Addendum)
Referring Provider: Ignatius Specking, MD Primary Care Physician:  Ignatius Specking, MD Primary GI Physician: [facility]   Chief Complaint  Patient presents with   Gastroesophageal Reflux    Follow up on GERD. Takes Protonix 20 mg in the mornings and states med is working well and no concerns.     HPI:   LANDRIE BOTTONE is a 72 y.o. female with past medical history of GERD, hyperlipidemia, hypertension and arthritis   Patient presenting today for follow up of GERD  Last seen August 2023, at that time, doing well, taking protonix 20mg  daily. Occasional dysphagia with certain foods.   Recommended to continue with low dose PPI daily  Present: Doing well on protonix 40mg  daily. She notes this keeps her symptoms well controlled. She has very occasional flare of GERD if she eats something very greasy. She will take tums if this occurs. No real issues with dysphagia, will have to make sure she chews breads and thicker foods thoroughly. She notes a history of eating fast and trying to slow down. No nausea or vomiting.   Doing fiber daily and having regular BMs. No rectal bleeding or melena. Appetite is good. Denies any weight loss.    Last EGD: 02/2019-- Esophageal polyp was found. Ablated via cold biopsy.  - LA Grade A reflux esophagitis with no bleeding.  - 3 cm hiatal hernia. - No endoscopic esophageal abnormality to explain  patient's dysphagia. Esophagus dilated prior to biopsy. Dilated.  - Gastritis. Biopsied.  - Normal duodenal bulb and second portion of the duodenum.   A. STOMACH, BIOPSY:  - Reactive gastropathy.  - Warthin-Starry is negative for Helicobacter pylori.  - No intestinal metaplasia, dysplasia, or malignancy.   B. ESOPHAGUS, POLYPECTOMY:  - Benign squamous papilloma.   Last Colonoscopy: 02/2019-- The examined portion of the ileum was normal. - Diverticulosis in the sigmoid colon.  - External hemorrhoids. - Anal papilla(e) were hypertrophied.  - No specimens  collected  Repeat colonoscopy in 7 years   Past Medical History:  Diagnosis Date   Arthritis 05-03-12   osteoarthritis - knees   GERD (gastroesophageal reflux disease)    tx. Prilosec   Hypercholesterolemia 04-15-12   tx. meds   Hypertension     Past Surgical History:  Procedure Laterality Date   BIOPSY  03/02/2019   Procedure: BIOPSY;  Surgeon: Malissa Hippo, MD;  [facility];  Service: Endoscopy;;  gastric   COLONOSCOPY N/A 03/16/2014   Procedure: COLONOSCOPY;  Surgeon: Malissa Hippo, MD;  [facility];  Service: Endoscopy;  Laterality: N/A;  1015 - moved to 2/11 @ 9:30   COLONOSCOPY W/ POLYPECTOMY     COLONOSCOPY WITH PROPOFOL N/A 03/02/2019   Procedure: COLONOSCOPY WITH PROPOFOL;  Surgeon: Malissa Hippo, MD;  [facility];  Service: Endoscopy;  Laterality: N/A;  10:30   ESOPHAGOGASTRODUODENOSCOPY (EGD) WITH PROPOFOL N/A 03/02/2019   Procedure: ESOPHAGOGASTRODUODENOSCOPY (EGD) /ESOPHAGEAL DILATION WITH PROPOFOL;  Surgeon: Malissa Hippo, MD;  [facility];  Service: Endoscopy;  Laterality: N/A;   POLYPECTOMY  03/02/2019   Procedure: POLYPECTOMY;  Surgeon: Malissa Hippo, MD;  [facility];  Service: Endoscopy;;  esophageal   TOTAL KNEE ARTHROPLASTY Left 05/10/2012   Procedure: TOTAL KNEE ARTHROPLASTY;  Surgeon: Loanne Drilling, MD;  [facility]: [facility];  Service: Orthopedics;  Laterality: Left;   TOTAL KNEE ARTHROPLASTY Right 05/19/2016   Procedure: RIGHT TOTAL KNEE ARTHROPLASTY;  Surgeon: Ollen Gross, MD;  Location: [facility];  Service: Orthopedics;  Laterality: Right;  requests   TUBAL LIGATION      Current Outpatient Medications  Medication Sig Dispense Refill   acetaminophen (TYLENOL) 500 MG tablet Take 1,000 mg by mouth every 6 (six) hours as needed (for pain.).     aspirin EC 81 MG tablet Take 1 tablet (81 mg total) by mouth at bedtime.     atorvastatin (LIPITOR) 10 MG tablet Take 10 mg by mouth at bedtime.    3   Calcium Carbonate-Vit D-Min (CALCIUM 1200 PO) Take 1 tablet by mouth 2 (two) times daily.      Cholecalciferol (VITAMIN D3) 10 MCG (400 UNIT) CAPS Take 1 tablet by mouth 2 (two) times daily.      Glucosamine-Chondroit-Vit C-Mn (GLUCOSAMINE 1500 COMPLEX) CAPS Take 1 capsule by mouth daily.      Magnesium 250 MG TABS Take 250 mg by mouth daily.      metoprolol (LOPRESSOR) 50 MG tablet Take 50 mg by mouth 2 (two) times daily.     Multiple Vitamins-Minerals (CENTRUM SILVER 50+WOMEN PO) Take 1 tablet by mouth daily.      pantoprazole (PROTONIX) 20 MG tablet Take 1 tablet (20 mg total) by mouth daily. 90 tablet 1   Propylene Glycol (SYSTANE COMPLETE OP) Place 1 drop into both eyes 2 (two) times daily.     psyllium (METAMUCIL) 58.6 % packet Take 1 packet by mouth. One spoonful at bedtime.     triamterene-hydrochlorothiazide (MAXZIDE-25) 37.5-25 MG per tablet Take 0.5 tablets by mouth daily.      No current facility-administered medications for this visit.    Allergies as of 10/30/2022 - Review Complete 09/23/2021  Allergen Reaction Noted   Macrodantin [nitrofurantoin] Other (See Comments) 05/12/2016   Keflex [cephalexin] Rash 05/07/2016    Family History  Problem Relation Age of Onset   Breast cancer Mother    Hypertension Mother    Hypertension Father    Dementia Father    Breast cancer Sister     Social History   Socioeconomic History   Marital status: Married    Spouse name: Not on file   Number of children: Not on file   Years of education: Not on file   Highest education level: Not on file  Occupational History   Not on file  Tobacco Use   Smoking status: Never   Smokeless tobacco: Never  Vaping Use   Vaping status: Never Used  Substance and Sexual Activity   Alcohol use: Not Currently    Comment: rare-seldom   Drug use: No   Sexual activity: Yes  Other Topics Concern   Not on file  Social History Narrative   Not on file   Social Determinants of Health    Financial Resource Strain: Not on file  Food Insecurity: Not on file  Transportation Needs: Not on file  Physical Activity: Not on file  Stress: Not on file  Social Connections: Not on file   Review of systems General: negative for malaise, night sweats, fever, chills, weight loss Neck: Negative for lumps, goiter, pain and significant neck swelling Resp: Negative for cough, wheezing, dyspnea at rest CV: Negative for chest pain, leg swelling, palpitations, orthopnea GI: denies melena, hematochezia, nausea, vomiting, diarrhea, constipation, dysphagia, odyonophagia, early satiety or unintentional weight loss. +occasional breakthrough of GERD MSK: Negative for joint pain or swelling, back pain, and muscle pain. Derm: Negative for itching or rash Psych: Denies depression, anxiety, memory loss, confusion. No homicidal or  suicidal ideation.  Heme: Negative for prolonged bleeding, bruising easily, and swollen nodes. Endocrine: Negative for cold or heat intolerance, polyuria, polydipsia and goiter. Neuro: negative for tremor, gait imbalance, syncope and seizures. The remainder of the review of systems is noncontributory.  Physical Exam: BP 130/80 (BP Location: Left Arm, Patient Position: Sitting, Cuff Size: Large)   Pulse (!) 58   Temp 98 F (36.7 C) (Oral)   Ht 5\' 6"  (1.676 m)   Wt 172 lb 1.6 oz (78.1 kg)   BMI 27.78 kg/m  General:   Alert and oriented. No distress noted. Pleasant and cooperative.  Head:  Normocephalic and atraumatic. Eyes:  Conjuctiva clear without scleral icterus. Mouth:  Oral mucosa pink and moist. Good dentition. No lesions. Heart: Normal rate and rhythm, s1 and s2 heart sounds present.  Lungs: Clear lung sounds in all lobes. Respirations equal and unlabored. Abdomen:  +BS, soft, non-tender and non-distended. No rebound or guarding. No HSM or masses noted. Derm: No palmar erythema or jaundice Msk:  Symmetrical without gross deformities. Normal  posture. Extremities:  Without edema. Neurologic:  Alert and  oriented x4 Psych:  Alert and cooperative. Normal mood and affect.  Invalid input(s): "6 MONTHS"   ASSESSMENT: MCKALA FONTENOT is a 72 y.o. female presenting today for follow up of GERD  GERD: well controlled on protonix 20mg  daily with occasional breakthrough if she eats something greasy, for which she takes tums for. Recommend continuing with protonix 20mg  daily and good reflux precautions, if she finds she is needing tums more or having more breakthrough symptoms, she should make me aware as we may need to change her PPI therapy.   Patient inquired about next colonoscopy, which will be due in 2028.   PLAN:  Continue with protonix 20mg  daily  2. Good reflux and chewing precautions   All questions were answered, patient verbalized understanding and is in agreement with plan as outlined above.   Follow Up: 1 year   Ameli Sangiovanni L. Jeanmarie Hubert, MSN, APRN, AGNP-C Adult-Gerontology Nurse Practitioner [facility]  I have reviewed the note and agree with the APP's assessment as described in this progress note  May want to discuss with patient non pharmacological options for GERD control such as TIF., she could be a great candidate for cTIF if interested.  Katrinka Blazing, MD Gastroenterology and Hepatology [facility]

## 2022-10-30 NOTE — Patient Instructions (Signed)
Please continue with protonix 20mg  daily Be mindful of greasy, spicy, fried, citrus foods, caffeine, carbonated drinks, chocolate and alcohol as these can increase reflux symptoms Stay upright 2-3 hours after eating, prior to lying down and avoid eating late in the evenings. If you find you are having more breakthrough reflux symptoms, please make me aware  Next colonoscopy due in 2028  Follow up 1 year  It was a pleasure to see you today. I want to create trusting relationships with patients and provide genuine, compassionate, and quality care. I truly value your feedback! please be on the lookout for a survey regarding your visit with me today. I appreciate your input about our visit and your time in completing this!    Jaimes Eckert L. Jeanmarie Hubert, MSN, APRN, AGNP-C Adult-Gerontology Nurse Practitioner [facility]

## 2022-11-06 DIAGNOSIS — N393 Stress incontinence (female) (male): Secondary | ICD-10-CM | POA: Diagnosis not present

## 2022-11-06 DIAGNOSIS — Z01419 Encounter for gynecological examination (general) (routine) without abnormal findings: Secondary | ICD-10-CM | POA: Diagnosis not present

## 2022-11-06 DIAGNOSIS — Z124 Encounter for screening for malignant neoplasm of cervix: Secondary | ICD-10-CM | POA: Diagnosis not present

## 2022-12-16 DIAGNOSIS — Z1231 Encounter for screening mammogram for malignant neoplasm of breast: Secondary | ICD-10-CM | POA: Diagnosis not present

## 2023-01-13 DIAGNOSIS — B351 Tinea unguium: Secondary | ICD-10-CM | POA: Diagnosis not present

## 2023-01-13 DIAGNOSIS — M79676 Pain in unspecified toe(s): Secondary | ICD-10-CM | POA: Diagnosis not present

## 2023-01-15 ENCOUNTER — Other Ambulatory Visit (INDEPENDENT_AMBULATORY_CARE_PROVIDER_SITE_OTHER): Payer: Self-pay | Admitting: Gastroenterology

## 2023-01-15 DIAGNOSIS — K219 Gastro-esophageal reflux disease without esophagitis: Secondary | ICD-10-CM

## 2023-04-07 DIAGNOSIS — H3540 Unspecified peripheral retinal degeneration: Secondary | ICD-10-CM | POA: Diagnosis not present

## 2023-04-14 DIAGNOSIS — B351 Tinea unguium: Secondary | ICD-10-CM | POA: Diagnosis not present

## 2023-05-18 DIAGNOSIS — L821 Other seborrheic keratosis: Secondary | ICD-10-CM | POA: Diagnosis not present

## 2023-05-18 DIAGNOSIS — Z7189 Other specified counseling: Secondary | ICD-10-CM | POA: Diagnosis not present

## 2023-05-18 DIAGNOSIS — I788 Other diseases of capillaries: Secondary | ICD-10-CM | POA: Diagnosis not present

## 2023-05-18 DIAGNOSIS — D225 Melanocytic nevi of trunk: Secondary | ICD-10-CM | POA: Diagnosis not present

## 2023-05-18 DIAGNOSIS — L814 Other melanin hyperpigmentation: Secondary | ICD-10-CM | POA: Diagnosis not present

## 2023-08-04 DIAGNOSIS — I10 Essential (primary) hypertension: Secondary | ICD-10-CM | POA: Diagnosis not present

## 2023-08-04 DIAGNOSIS — Z7189 Other specified counseling: Secondary | ICD-10-CM | POA: Diagnosis not present

## 2023-08-04 DIAGNOSIS — R5383 Other fatigue: Secondary | ICD-10-CM | POA: Diagnosis not present

## 2023-08-04 DIAGNOSIS — Z1331 Encounter for screening for depression: Secondary | ICD-10-CM | POA: Diagnosis not present

## 2023-08-04 DIAGNOSIS — Z299 Encounter for prophylactic measures, unspecified: Secondary | ICD-10-CM | POA: Diagnosis not present

## 2023-08-04 DIAGNOSIS — Z0000 Encounter for general adult medical examination without abnormal findings: Secondary | ICD-10-CM | POA: Diagnosis not present

## 2023-08-04 DIAGNOSIS — R32 Unspecified urinary incontinence: Secondary | ICD-10-CM | POA: Diagnosis not present

## 2023-08-04 DIAGNOSIS — Z1339 Encounter for screening examination for other mental health and behavioral disorders: Secondary | ICD-10-CM | POA: Diagnosis not present

## 2023-08-13 DIAGNOSIS — B351 Tinea unguium: Secondary | ICD-10-CM | POA: Diagnosis not present

## 2023-08-13 DIAGNOSIS — M79674 Pain in right toe(s): Secondary | ICD-10-CM | POA: Diagnosis not present

## 2023-08-13 DIAGNOSIS — M79675 Pain in left toe(s): Secondary | ICD-10-CM | POA: Diagnosis not present

## 2023-10-09 DIAGNOSIS — R5383 Other fatigue: Secondary | ICD-10-CM | POA: Diagnosis not present

## 2023-10-09 DIAGNOSIS — Z299 Encounter for prophylactic measures, unspecified: Secondary | ICD-10-CM | POA: Diagnosis not present

## 2023-10-09 DIAGNOSIS — Z0000 Encounter for general adult medical examination without abnormal findings: Secondary | ICD-10-CM | POA: Diagnosis not present

## 2023-10-09 DIAGNOSIS — E7800 Pure hypercholesterolemia, unspecified: Secondary | ICD-10-CM | POA: Diagnosis not present

## 2023-10-09 DIAGNOSIS — I10 Essential (primary) hypertension: Secondary | ICD-10-CM | POA: Diagnosis not present

## 2023-10-09 DIAGNOSIS — Z79899 Other long term (current) drug therapy: Secondary | ICD-10-CM | POA: Diagnosis not present

## 2023-10-29 ENCOUNTER — Ambulatory Visit (INDEPENDENT_AMBULATORY_CARE_PROVIDER_SITE_OTHER): Payer: Medicare PPO | Admitting: Gastroenterology

## 2023-10-29 ENCOUNTER — Encounter (INDEPENDENT_AMBULATORY_CARE_PROVIDER_SITE_OTHER): Payer: Self-pay | Admitting: Gastroenterology

## 2023-10-29 ENCOUNTER — Telehealth (INDEPENDENT_AMBULATORY_CARE_PROVIDER_SITE_OTHER): Payer: Self-pay | Admitting: Gastroenterology

## 2023-10-29 VITALS — BP 113/67 | HR 53 | Temp 98.6°F | Ht 66.0 in | Wt 168.4 lb

## 2023-10-29 DIAGNOSIS — K219 Gastro-esophageal reflux disease without esophagitis: Secondary | ICD-10-CM

## 2023-10-29 DIAGNOSIS — R151 Fecal smearing: Secondary | ICD-10-CM | POA: Diagnosis not present

## 2023-10-29 NOTE — Telephone Encounter (Signed)
 Pt returned call and verbalized understanding

## 2023-10-29 NOTE — Progress Notes (Signed)
 Referring Provider: Rosamond Leta NOVAK, MD Primary Care Physician:  Rosamond Leta NOVAK, MD Primary GI Physician: Dr. Eartha   Chief Complaint  Patient presents with   Follow-up    Pt arrives for follow up. Pt states digestion is an issue. Having some leakage, wears a pad at night. Pt is wondering if those issues are age related. Will have some looser Bms at times. Refills on Pantoprazole  are expiring-pt does currently have 3 months. Last TCS in 2021-per Dr.Rehman repeat in 7 years;pt states she thought it was to be every 5 years.    HPI:   Dorothy Lawson is a 73 y.o. female with past medical history of GERD, hyperlipidemia, hypertension and arthritis   Patient presenting today for:  GERD and fecal smearing    Last seen August 2023, at that time taking protonix  20mg  daily with good control over her GERD, some occasional dysphagia, some weight loss due to swimming and trying to mange weight   Recommended continue protonix  20mg  daily  Present:  She is taking protonix  20mg  daily, 30 minutes prior to breakfast. She notes she is having some abdominal discomfort at times, maybe related to eating spicy foods. She is trying to avoid fried foods and work on her diet some. She is trying to drink more water . Discomfort tends to be across the top of her stomach, and does not occur all the time. No nausea or vomiting, no changes in appetite or early satiety. She does take tums or rolaids on occasion which seems to induce belching and improves her discomfort. Denies rectal bleeding or melena. Denies any NSAIDs.   She notes some leakage of stool at times, she wears a pad to help with this. Denies full bowel incontinence. Stools can be looser with some urgency at times, though usually more solid stools. No watery diarrhea. Looser stools have been going on for about 1 month. She denies any recent new meds. She is taking magnesium  250mg  daily (unsure of type). Has had recent labs done with PCP. She does take 1  spoonful of metamucil daily.    Last TCS: 02/2019:  - The examined portion of the ileum was normal.                           - Diverticulosis in the sigmoid colon.                           - External hemorrhoids.                           - Anal papilla(e) were hypertrophied.                           - No specimens collected. Last EGD: 02/2019-- Esophageal polyp was found. Ablated via cold biopsy.  - LA Grade A reflux esophagitis with no bleeding.  - 3 cm hiatal hernia. - No endoscopic esophageal abnormality to explain  patient's dysphagia. Esophagus dilated prior to biopsy. Dilated.  - Gastritis. Biopsied.  - Normal duodenal bulb and second portion of the duodenum.   A. STOMACH, BIOPSY:  - Reactive gastropathy.  - Warthin-Starry is negative for Helicobacter pylori.  - No intestinal metaplasia, dysplasia, or malignancy.   B. ESOPHAGUS, POLYPECTOMY:  - Benign squamous papilloma.   Last Colonoscopy: 02/2019-- The examined portion of the ileum was  normal. - Diverticulosis in the sigmoid colon.  - External hemorrhoids. - Anal papilla(e) were hypertrophied.  - No specimens collected  Repeat TCS in 2028 (7 years time frame due to history of colonic adenomas and multiple adenomas in her sister)   Dorothy Lawson   10/29/23 1049  Weight: 168 lb 6.4 oz (76.4 kg)     Past Medical History:  Diagnosis Date   Arthritis 05-03-12   osteoarthritis - knees   GERD (gastroesophageal reflux disease)    tx. Prilosec   Hypercholesterolemia 04-15-12   tx. meds   Hypertension     Past Surgical History:  Procedure Laterality Date   BIOPSY  03/02/2019   Procedure: BIOPSY;  Surgeon: Golda Claudis PENNER, MD;  [facility]: [facility];  Service: Endoscopy;;  gastric   COLONOSCOPY N/A 03/16/2014   Procedure: COLONOSCOPY;  Surgeon: Claudis PENNER Golda, MD;  Location: [facility];  Service: Endoscopy;  Laterality: N/A;  1015 - moved to 2/11 @ 9:30   COLONOSCOPY W/ POLYPECTOMY     COLONOSCOPY WITH PROPOFOL   N/A 03/02/2019   Procedure: COLONOSCOPY WITH PROPOFOL ;  Surgeon: Golda Claudis PENNER, MD;  [facility]: [facility];  Service: Endoscopy;  Laterality: N/A;  10:30   ESOPHAGOGASTRODUODENOSCOPY (EGD) WITH PROPOFOL  N/A 03/02/2019   Procedure: ESOPHAGOGASTRODUODENOSCOPY (EGD) /ESOPHAGEAL DILATION WITH PROPOFOL ;  Surgeon: Golda Claudis PENNER, MD;  Location: [facility];  Service: Endoscopy;  Laterality: N/A;   POLYPECTOMY  03/02/2019   Procedure: POLYPECTOMY;  Surgeon: Golda Claudis PENNER, MD;  Location: [facility];  Service: Endoscopy;;  esophageal   TOTAL KNEE ARTHROPLASTY Left 05/10/2012   Procedure: TOTAL KNEE ARTHROPLASTY;  Surgeon: Dempsey LULLA Moan, MD;  Location: [facility];  Service: Orthopedics;  Laterality: Left;   TOTAL KNEE ARTHROPLASTY Right 05/19/2016   Procedure: RIGHT TOTAL KNEE ARTHROPLASTY;  Surgeon: Dempsey Moan, MD;  Location: [facility];  Service: Orthopedics;  Laterality: Right;  requests   TUBAL LIGATION      Current Outpatient Medications  Medication Sig Dispense Refill   Acetaminophen  (ARTHRITIS PAIN PO) Take 2 tablets by mouth daily.     aspirin EC 81 MG tablet Take 1 tablet (81 mg total) by mouth at bedtime.     atorvastatin  (LIPITOR) 10 MG tablet Take 10 mg by mouth at bedtime.   3   Calcium  Carbonate-Vit D-Min (CALCIUM  1200 PO) Take 1 tablet by mouth 2 (two) times daily.      Cholecalciferol (VITAMIN D3) 10 MCG (400 UNIT) CAPS Take 1 tablet by mouth 2 (two) times daily.      Cyanocobalamin (B-12 PO) Take by mouth.     Glucosamine-Chondroit-Vit C-Mn (GLUCOSAMINE 1500 COMPLEX) CAPS Take 1 capsule by mouth daily.      Magnesium  250 MG TABS Take 250 mg by mouth daily.      metoprolol  (LOPRESSOR ) 50 MG tablet Take 50 mg by mouth 2 (two) times daily.     Multiple Vitamins-Minerals (CENTRUM SILVER 50+WOMEN PO) Take 1 tablet by mouth daily.      pantoprazole  (PROTONIX ) 20 MG tablet TAKE 1 TABLET (20 MG TOTAL) BY MOUTH DAILY. 90 tablet 3   Propylene Glycol (SYSTANE COMPLETE OP) Place 1  drop into both eyes 2 (two) times daily.     Tavaborole 5 % SOLN daily.     triamterene -hydrochlorothiazide  (MAXZIDE -25) 37.5-25 MG per tablet Take 0.5 tablets by mouth daily.      acetaminophen  (TYLENOL ) 500 MG tablet Take 1,000 mg by mouth every 6 (six) hours as needed (for pain.). (  Patient not taking: Reported on 10/29/2023)     psyllium (METAMUCIL) 58.6 % packet Take 1 packet by mouth. One spoonful at bedtime. (Patient not taking: Reported on 10/29/2023)     No current facility-administered medications for this visit.    Allergies as of 10/29/2023 - Review Complete 10/29/2023  Allergen Reaction Noted   Macrodantin [nitrofurantoin] Other (See Comments) 05/12/2016   Keflex [cephalexin] Rash 05/07/2016    Social History   Socioeconomic History   Marital status: Married    Spouse name: Not on file   Number of children: Not on file   Years of education: Not on file   Highest education level: Not on file  Occupational History   Not on file  Tobacco Use   Smoking status: Never   Smokeless tobacco: Never  Vaping Use   Vaping status: Never Used  Substance and Sexual Activity   Alcohol  use: Not Currently    Comment: rare-seldom   Drug use: No   Sexual activity: Yes  Other Topics Concern   Not on file  Social History Narrative   Not on file   Social Drivers of Health   Financial Resource Strain: Not on file  Food Insecurity: Not on file  Transportation Needs: Not on file  Physical Activity: Not on file  Stress: Not on file  Social Connections: Not on file    Review of systems General: negative for malaise, night sweats, fever, chills, weight los Neck: Negative for lumps, goiter, pain and significant neck swelling Resp: Negative for cough, wheezing, dyspnea at rest CV: Negative for chest pain, leg swelling, palpitations, orthopnea GI: denies melena, hematochezia, nausea, vomiting, diarrhea, constipation, dysphagia, odyonophagia, early satiety or unintentional weight loss.  +upper abdominal discomfort +GERD +fecal smearing  MSK: Negative for joint pain or swelling, back pain, and muscle pain. Derm: Negative for itching or rash Psych: Denies depression, anxiety, memory loss, confusion. No homicidal or suicidal ideation.  Heme: Negative for prolonged bleeding, bruising easily, and swollen nodes. Endocrine: Negative for cold or heat intolerance, polyuria, polydipsia and goiter. Neuro: negative for tremor, gait imbalance, syncope and seizures. The remainder of the review of systems is noncontributory.  Physical Exam: BP 113/67   Pulse (!) 53   Temp 98.6 F (37 C)   Ht 5' 6 (1.676 m)   Wt 168 lb 6.4 oz (76.4 kg)   BMI 27.18 kg/m  General:   Alert and oriented. No distress noted. Pleasant and cooperative.  Head:  Normocephalic and atraumatic. Eyes:  Conjuctiva clear without scleral icterus. Mouth:  Oral mucosa pink and moist. Good dentition. No lesions. Heart: Normal rate and rhythm, s1 and s2 heart sounds present.  Lungs: Clear lung sounds in all lobes. Respirations equal and unlabored. Abdomen:  +BS, soft, non-tender and non-distended. No rebound or guarding. No HSM or masses noted. Rectal: declined  Derm: No palmar erythema or jaundice Msk:  Symmetrical without gross deformities. Normal posture. Extremities:  Without edema. Neurologic:  Alert and  oriented x4 Psych:  Alert and cooperative. Normal mood and affect.  Invalid input(s): 6 MONTHS   ASSESSMENT: Dorothy Lawson is a 73 y.o. female presenting today or GERD and fecal smearing   GERD: having more reflux symptoms, some epigastric discomfort at times on protonix  20mg  daily. Symptoms sometimes improved with belching. Will try increasing her PPI to 40mg  daily to see if this improves things, she should also be mindful of good reflux precautions. Will consider EGD if symptoms do not improve with increase in PPI,  but will hold off for now given no alarm symptoms.   Fecal seepage: taking 1T of  metamucil daily, also on magnesium  though uncertain of the kind. She notes some looser stools and some fecal leakage more recently though no overt fecal incontinence or diarrhea. No recent antibiotic exposure or change in medications. Query if magnesium  is contributing to her looser stools, though could be some age related pelvic floor dysfunction as well. I requested she let me know the type of magnesium  once she arrives back home. Will increase metamucil to BID to help bulk the stools. She has no rectal bleeding or melena. Last colonoscopy in 2021, advised to repeat in 7 years. Consider further evaluation via colonoscopy, possible anorectal manometry if leakage persists despite the above recommendations.    PLAN:  -increase protonix  to 40mg  daily, pt to make me aware if symptoms not improving, consider change in PPI  -be mindful of magnesium  types ( pt to make me aware of type once she arrives back home so I can provide further recommendations regarding this) -increase metamucil to BID with a meal -Increase water  intake, aim for atleast 64 oz per day -Increase fruits, veggies and whole grains, kiwi and prunes are especially good for constipation -consider colonoscopy/possible anorectal manometry if stool leakage persists  All questions were answered, patient verbalized understanding and is in agreement with plan as outlined above.   Follow Up: 3 months   Paislea Hatton L. Mariette, MSN, APRN, AGNP-C Adult-Gerontology Nurse Practitioner [facility]  I have reviewed the note and agree with the APP's assessment as described in this progress note  Toribio Fortune, MD Gastroenterology and Hepatology [facility]

## 2023-10-29 NOTE — Telephone Encounter (Signed)
 Left detailed voicemail on pt phone-OK per [facility]

## 2023-10-29 NOTE — Patient Instructions (Signed)
-  increase protonix  to 40mg  daily, pt to make me aware if symptoms not improving, consider change in PPI  -be mindful of magnesium  types, let me know once you get home and see what type of magnesium  you are on -increase metamucil to twice daily with a meal -Increase water  intake, aim for atleast 64 oz per day -Increase fruits, veggies and whole grains, kiwi and prunes are especially good for constipation  Follow up 3 months  It was a pleasure to see you today. I want to create trusting relationships with patients and provide genuine, compassionate, and quality care. I truly value your feedback! please be on the lookout for a survey regarding your visit with me today. I appreciate your input about our visit and your time in completing this!    Natividad Schlosser L. Taletha Twiford, MSN, APRN, AGNP-C Adult-Gerontology Nurse Practitioner [facility]astroenter[facility] at [facility]

## 2023-10-29 NOTE — Telephone Encounter (Signed)
 Pt left voicemail that she is taking Magnesium  Oxide. She states that she also sent a text with pictures.

## 2023-11-02 DIAGNOSIS — R151 Fecal smearing: Secondary | ICD-10-CM | POA: Insufficient documentation

## 2023-11-05 DIAGNOSIS — M79675 Pain in left toe(s): Secondary | ICD-10-CM | POA: Diagnosis not present

## 2023-11-05 DIAGNOSIS — M79674 Pain in right toe(s): Secondary | ICD-10-CM | POA: Diagnosis not present

## 2023-11-05 DIAGNOSIS — B351 Tinea unguium: Secondary | ICD-10-CM | POA: Diagnosis not present

## 2023-11-13 DIAGNOSIS — Z299 Encounter for prophylactic measures, unspecified: Secondary | ICD-10-CM | POA: Diagnosis not present

## 2023-11-13 DIAGNOSIS — R52 Pain, unspecified: Secondary | ICD-10-CM | POA: Diagnosis not present

## 2023-11-13 DIAGNOSIS — R350 Frequency of micturition: Secondary | ICD-10-CM | POA: Diagnosis not present

## 2023-11-13 DIAGNOSIS — N390 Urinary tract infection, site not specified: Secondary | ICD-10-CM | POA: Diagnosis not present

## 2023-11-13 DIAGNOSIS — I10 Essential (primary) hypertension: Secondary | ICD-10-CM | POA: Diagnosis not present

## 2023-11-18 ENCOUNTER — Encounter (INDEPENDENT_AMBULATORY_CARE_PROVIDER_SITE_OTHER): Payer: Self-pay | Admitting: Gastroenterology

## 2023-12-08 DIAGNOSIS — E2839 Other primary ovarian failure: Secondary | ICD-10-CM | POA: Diagnosis not present

## 2023-12-22 DIAGNOSIS — Z1231 Encounter for screening mammogram for malignant neoplasm of breast: Secondary | ICD-10-CM | POA: Diagnosis not present

## 2023-12-29 ENCOUNTER — Other Ambulatory Visit (INDEPENDENT_AMBULATORY_CARE_PROVIDER_SITE_OTHER): Payer: Self-pay

## 2023-12-29 ENCOUNTER — Telehealth (INDEPENDENT_AMBULATORY_CARE_PROVIDER_SITE_OTHER): Payer: Self-pay

## 2023-12-29 ENCOUNTER — Telehealth (INDEPENDENT_AMBULATORY_CARE_PROVIDER_SITE_OTHER): Payer: Self-pay | Admitting: Gastroenterology

## 2023-12-29 ENCOUNTER — Encounter (INDEPENDENT_AMBULATORY_CARE_PROVIDER_SITE_OTHER): Payer: Self-pay | Admitting: Gastroenterology

## 2023-12-29 DIAGNOSIS — K219 Gastro-esophageal reflux disease without esophagitis: Secondary | ICD-10-CM

## 2023-12-29 MED ORDER — PANTOPRAZOLE SODIUM 40 MG PO TBEC: 40.0000 mg | DELAYED_RELEASE_TABLET | Freq: Every day | ORAL | 3 refills | Status: DC

## 2023-12-29 NOTE — Telephone Encounter (Signed)
 I have submitted the script to the requested pharmacy and called and left the patient a vm that I had done so.   Patient called in today to have us  send in her Pantoprazole  40 mg, which was changed at last ov with Chelsea on 10/29/2023 from 20 mg daily to 40 mg daily. Per Mitzie Boettcher, Np verbal ok to submit.

## 2023-12-29 NOTE — Telephone Encounter (Signed)
 Pt returning call. She is aware of her 3 month follow up with [facility] and has questions about her medications being sent to [facility] and her insurance changing in January 2026. I told her the nurse was on another call and would call her back. [phone]

## 2023-12-29 NOTE — Telephone Encounter (Signed)
 I spoke with the patient and made her aware we had submitted the script for a 90 days supply to [facility]. Patient states understanding.

## 2024-01-14 DIAGNOSIS — B351 Tinea unguium: Secondary | ICD-10-CM | POA: Diagnosis not present

## 2024-01-14 DIAGNOSIS — M79674 Pain in right toe(s): Secondary | ICD-10-CM | POA: Diagnosis not present

## 2024-01-14 DIAGNOSIS — M79675 Pain in left toe(s): Secondary | ICD-10-CM | POA: Diagnosis not present

## 2024-01-25 ENCOUNTER — Encounter (INDEPENDENT_AMBULATORY_CARE_PROVIDER_SITE_OTHER): Payer: Self-pay | Admitting: Gastroenterology

## 2024-01-25 ENCOUNTER — Ambulatory Visit (INDEPENDENT_AMBULATORY_CARE_PROVIDER_SITE_OTHER): Admitting: Gastroenterology

## 2024-01-25 VITALS — BP 148/84 | HR 69 | Temp 97.5°F | Ht 66.0 in | Wt 168.0 lb

## 2024-01-25 DIAGNOSIS — K219 Gastro-esophageal reflux disease without esophagitis: Secondary | ICD-10-CM | POA: Diagnosis not present

## 2024-01-25 DIAGNOSIS — R151 Fecal smearing: Secondary | ICD-10-CM | POA: Diagnosis not present

## 2024-01-25 NOTE — Patient Instructions (Signed)
-  continue protonix  40mg  daily -increase metamucil to twice daily -take daily probiotic while on cipro and 7-10 days after -take probiotic about 4 hours apart from cipro  -continue good water  intake  Follow up 6 months  It was a pleasure to see you today. I want to create trusting relationships with patients and provide genuine, compassionate, and quality care. I truly value your feedback! please be on the lookout for a survey regarding your visit with me today. I appreciate your input about our visit and your time in completing this!    Brolin Dambrosia L. Aubrei Bouchie, MSN, APRN, AGNP-C Adult-Gerontology Nurse Practitioner [facility] Gastroenterology at [facility]

## 2024-01-25 NOTE — Progress Notes (Signed)
 "  Referring Provider: Rosamond Leta NOVAK, Dorothy Lawson Primary Care Physician:  Rosamond Leta NOVAK, Dorothy Lawson Primary GI Physician: Dr. Eartha   Chief Complaint  Patient presents with   Follow-up    Patient here today for a follow up on Gerd. Patient says this is some what better since increasing pantoprazole  to 40 mg once per day.    HPI:   Dorothy Lawson is a 73 y.o. female with past medical history of GERD, hyperlipidemia, hypertension and arthritis   Patient presenting today for:  Follow up of GERD and fecal smearing    Last seen September, at that time taking protonix  20mg  daily, having some abdominal discomfort with spicy foods. Taking tums/rolaids at times to help. Reported some leakage of stool, no full bowel incontinence. Sometimes looser stools but no watery stools x1 month. Taking magnesium  250mg  daily and 1 spoonful metamucil daily  Recommended to increase to protonix  40mg  daily, increase metamucil to BID with a meal, consdier colonoscopy/ anorectal manometry if stool leakage persists  CBC and CMP grossly unremarkable in September with TSH 1.64  Present:  States she had recent left shoulder issue, just finished up course of steroids then started to feel that she was developing a UTI so just started cipro today.   Feels GERD is doing much better since increasing to 40mg  of protonix  daily. Belching has improved. No abdominal pain, nausea, or vomiting.   She is doing 1.5-2 teaspoonfuls of metamucil now at night. Still having some leakage of stools. Usually having 2 BMs in the morning, stools are looser at times. She also notes some leakage of urine at times as well. Trying to drink a lot of water . She did stop magnesium  recently.    Last TCS: 02/2019:  - The examined portion of the ileum was normal.                           - Diverticulosis in the sigmoid colon.                           - External hemorrhoids.                           - Anal papilla(e) were hypertrophied.                            - No specimens collected. Last EGD: 02/2019-- Esophageal polyp was found. Ablated via cold biopsy.  - LA Grade A reflux esophagitis with no bleeding.  - 3 cm hiatal hernia. - No endoscopic esophageal abnormality to explain  patient's dysphagia. Esophagus dilated prior to biopsy. Dilated.  - Gastritis. Biopsied.  - Normal duodenal bulb and second portion of the duodenum.   A. STOMACH, BIOPSY:  - Reactive gastropathy.  - Warthin-Starry is negative for Helicobacter pylori.  - No intestinal metaplasia, dysplasia, or malignancy.   B. ESOPHAGUS, POLYPECTOMY:  - Benign squamous papilloma.   Last Colonoscopy: 02/2019-- The examined portion of the ileum was normal. - Diverticulosis in the sigmoid colon.  - External hemorrhoids. - Anal papilla(e) were hypertrophied.  - No specimens collected   Repeat TCS in 2028 (7 years time frame due to history of colonic adenomas and multiple adenomas in her sister)  Dorothy Lawson   01/25/24 1533  Weight: 168 lb (76.2 kg)     Past Medical History:  Diagnosis Date   Arthritis 05-03-12   osteoarthritis - knees   GERD (gastroesophageal reflux disease)    tx. Prilosec   Hypercholesterolemia 04-15-12   tx. meds   Hypertension     Past Surgical History:  Procedure Laterality Date   BIOPSY  03/02/2019   Procedure: BIOPSY;  Surgeon: Golda Claudis PENNER, Dorothy Lawson;  Location: [facility];  Service: Endoscopy;;  gastric   COLONOSCOPY N/A 03/16/2014   Procedure: COLONOSCOPY;  Surgeon: Claudis PENNER Golda, Dorothy Lawson;  Location: [facility];  Service: Endoscopy;  Laterality: N/A;  1015 - moved to 2/11 @ 9:30   COLONOSCOPY W/ POLYPECTOMY     COLONOSCOPY WITH PROPOFOL  N/A 03/02/2019   Procedure: COLONOSCOPY WITH PROPOFOL ;  Surgeon: Golda Claudis PENNER, Dorothy Lawson;  Location: [facility];  Service: Endoscopy;  Laterality: N/A;  10:30   ESOPHAGOGASTRODUODENOSCOPY (EGD) WITH PROPOFOL  N/A 03/02/2019   Procedure: ESOPHAGOGASTRODUODENOSCOPY (EGD) /ESOPHAGEAL DILATION WITH PROPOFOL ;   Surgeon: Golda Claudis PENNER, Dorothy Lawson;  Location: [facility];  Service: Endoscopy;  Laterality: N/A;   POLYPECTOMY  03/02/2019   Procedure: POLYPECTOMY;  Surgeon: Golda Claudis PENNER, Dorothy Lawson;  Location: [facility];  Service: Endoscopy;;  esophageal   TOTAL KNEE ARTHROPLASTY Left 05/10/2012   Procedure: TOTAL KNEE ARTHROPLASTY;  Surgeon: Dempsey LULLA Moan, Dorothy Lawson;  Location: [facility];  Service: Orthopedics;  Laterality: Left;   TOTAL KNEE ARTHROPLASTY Right 05/19/2016   Procedure: RIGHT TOTAL KNEE ARTHROPLASTY;  Surgeon: Dempsey Moan, Dorothy Lawson;  Location: [facility];  Service: Orthopedics;  Laterality: Right;  requests   TUBAL LIGATION      Current Outpatient Medications  Medication Sig Dispense Refill   Acetaminophen  (ARTHRITIS PAIN PO) Take 2 tablets by mouth daily.     aspirin EC 81 MG tablet Take 1 tablet (81 mg total) by mouth at bedtime.     atorvastatin  (LIPITOR) 10 MG tablet Take 10 mg by mouth at bedtime.   3   Calcium  Carbonate-Vit D-Min (CALCIUM  1200 PO) Take 1 tablet by mouth 2 (two) times daily.      Cholecalciferol (VITAMIN D3) 10 MCG (400 UNIT) CAPS Take 1 tablet by mouth 2 (two) times daily.      Cyanocobalamin (B-12 PO) Take by mouth. (Patient taking differently: Take by mouth daily at 6 (six) AM. 1000 mg every daily.)     Glucosamine-Chondroit-Vit C-Mn (GLUCOSAMINE 1500 COMPLEX) CAPS Take 1 capsule by mouth daily.      metoprolol  (LOPRESSOR ) 50 MG tablet Take 50 mg by mouth 2 (two) times daily.     Multiple Vitamins-Minerals (CENTRUM SILVER 50+WOMEN PO) Take 1 tablet by mouth daily.      Omega-3 Fatty Acids (FISH OIL) 1000 MG CAPS Take by mouth at bedtime.     pantoprazole  (PROTONIX ) 40 MG tablet Take 1 tablet (40 mg total) by mouth daily. 90 tablet 3   Propylene Glycol (SYSTANE COMPLETE OP) Place 1 drop into both eyes 2 (two) times daily.     psyllium (METAMUCIL) 58.6 % packet Take 1 packet by mouth. One spoonful at bedtime. (Patient taking differently: Take 1 packet by mouth. One spoonful and a  half at bedtime.)     Tavaborole 5 % SOLN daily.     triamterene -hydrochlorothiazide  (MAXZIDE -25) 37.5-25 MG per tablet Take 0.5 tablets by mouth daily.      No current facility-administered medications for this visit.    Allergies as of 01/25/2024 - Review Complete 01/25/2024  Allergen Reaction Noted   Macrodantin [nitrofurantoin] Other (See Comments) 05/12/2016   Keflex [cephalexin]  Rash 05/07/2016    Social History   Socioeconomic History   Marital status: Married    Spouse name: Not on file   Number of children: Not on file   Years of education: Not on file   Highest education level: Not on file  Occupational History   Not on file  Tobacco Use   Smoking status: Never   Smokeless tobacco: Never  Vaping Use   Vaping status: Never Used  Substance and Sexual Activity   Alcohol  use: Not Currently    Comment: rare-seldom   Drug use: No   Sexual activity: Yes  Other Topics Concern   Not on file  Social History Narrative   Not on file   Social Drivers of Health   Tobacco Use: Low Risk (01/25/2024)   Patient History    Smoking Tobacco Use: Never    Smokeless Tobacco Use: Never    Passive Exposure: Not on file  Financial Resource Strain: Not on file  Food Insecurity: Not on file  Transportation Needs: Not on file  Physical Activity: Not on file  Stress: Not on file  Social Connections: Not on file  Depression (PHQ2-9): Not on file  Alcohol  Screen: Not on file  Housing: Not on file  Utilities: Not on file  Health Literacy: Not on file    Review of systems General: negative for malaise, night sweats, fever, chills, weight loss Neck: Negative for lumps, goiter, pain and significant neck swelling Resp: Negative for cough, wheezing, dyspnea at rest CV: Negative for chest pain, leg swelling, palpitations, orthopnea GI: denies melena, hematochezia, nausea, vomiting, diarrhea, constipation, dysphagia, odyonophagia, early satiety or unintentional weight loss. +fecal  smearing  MSK: Negative for joint pain or swelling, back pain, and muscle pain. Derm: Negative for itching or rash Psych: Denies depression, anxiety, memory loss, confusion. No homicidal or suicidal ideation.  Heme: Negative for prolonged bleeding, bruising easily, and swollen nodes. Endocrine: Negative for cold or heat intolerance, polyuria, polydipsia and goiter. Neuro: negative for tremor, gait imbalance, syncope and seizures. The remainder of the review of systems is noncontributory.  Physical Exam: BP (!) 148/84 (BP Location: Left Arm, Patient Position: Sitting, Cuff Size: Normal)   Pulse 69   Temp (!) 97.5 F (36.4 C) (Temporal)   Ht 5' 6 (1.676 m)   Wt 168 lb (76.2 kg)   BMI 27.12 kg/m  General:   Alert and oriented. No distress noted. Pleasant and cooperative.  Head:  Normocephalic and atraumatic. Eyes:  Conjuctiva clear without scleral icterus. Mouth:  Oral mucosa pink and moist. Good dentition. No lesions. Heart: Normal rate and rhythm, s1 and s2 heart sounds present.  Lungs: Clear lung sounds in all lobes. Respirations equal and unlabored. Abdomen:  +BS, soft, non-tender and non-distended. No rebound or guarding. No HSM or masses noted. Derm: No palmar erythema or jaundice Msk:  Symmetrical without gross deformities. Normal posture. Extremities:  Without edema. Neurologic:  Alert and  oriented x4 Psych:  Alert and cooperative. Normal mood and affect.  Invalid input(s): 6 MONTHS   ASSESSMENT: SHEILY LINEMAN is a 73 y.o. female presenting today for follow up of GERD and fecal smearing   GERD: much improved with increase to protonix  40mg  daily. Notes belching and epigastric discomfort have resolved. We will continue with current PPI regimen  Fecal smearing: some better on metamucil though only taking about 1.5 to 2 teaspooonfuls nightly. She also notes some leakage or urine. Suspect pelvic floor dysfunction. I recommended she increase metamucil to  BID dosing to  help bulk stools a bit more. May eventually benefit from pelvic floor physical therapy.    PLAN:  -continue protonix  40mg  daily -increase metamucil to twice daily -take daily probiotic while on cipro and 7-10 days after -take probiotic about 4 hours apart from cipro  -continue good water  intake  All questions were answered, patient verbalized understanding and is in agreement with plan as outlined above.   Follow Up: 6 months   Dorothy Katzenberger L. Mariette, MSN, APRN, AGNP-C Adult-Gerontology Nurse Practitioner [facility]  I have reviewed the note and agree with the APP's assessment as described in this progress note  Dorothy Fortune, Dorothy Lawson [facility] "

## 2024-02-08 ENCOUNTER — Telehealth (INDEPENDENT_AMBULATORY_CARE_PROVIDER_SITE_OTHER): Payer: Self-pay | Admitting: Gastroenterology

## 2024-02-08 DIAGNOSIS — K219 Gastro-esophageal reflux disease without esophagitis: Secondary | ICD-10-CM

## 2024-02-08 MED ORDER — PANTOPRAZOLE SODIUM 40 MG PO TBEC: 40.0000 mg | DELAYED_RELEASE_TABLET | Freq: Every day | ORAL | 3 refills | Status: AC

## 2024-02-08 NOTE — Telephone Encounter (Signed)
 Pt left voicemail stating that she is now using [facility] through [employer] now. Pt is needing script sent to [facility] for Pantoprazole  40 mg sent.  Script sent to [facility]
